# Patient Record
Sex: Male | Born: 1965 | Race: White | Hispanic: No | Marital: Single | Smoking: Former smoker
Health system: Southern US, Community
[De-identification: ages and names within clinical notes are randomized; demographics above are authoritative.]

## PROBLEM LIST (undated history)

## (undated) DIAGNOSIS — N200 Calculus of kidney: Secondary | ICD-10-CM

## (undated) DIAGNOSIS — C4359 Malignant melanoma of other part of trunk: Secondary | ICD-10-CM

## (undated) DIAGNOSIS — K5792 Diverticulitis of intestine, part unspecified, without perforation or abscess without bleeding: Secondary | ICD-10-CM

## (undated) DIAGNOSIS — K631 Perforation of intestine (nontraumatic): Secondary | ICD-10-CM

## (undated) DIAGNOSIS — I10 Essential (primary) hypertension: Secondary | ICD-10-CM

## (undated) DIAGNOSIS — E119 Type 2 diabetes mellitus without complications: Secondary | ICD-10-CM

## (undated) DIAGNOSIS — I509 Heart failure, unspecified: Secondary | ICD-10-CM

## (undated) HISTORY — PX: OTHER SURGICAL HISTORY: SHX169

## (undated) HISTORY — PX: COLON SURGERY: SHX602

## (undated) HISTORY — PX: KIDNEY STONE SURGERY: SHX686

---

## 1999-07-27 ENCOUNTER — Emergency Department (HOSPITAL_COMMUNITY): Admission: EM | Admit: 1999-07-27 | Discharge: 1999-07-27 | Payer: Self-pay | Admitting: Emergency Medicine

## 1999-07-27 ENCOUNTER — Encounter: Payer: Self-pay | Admitting: Emergency Medicine

## 1999-08-20 ENCOUNTER — Other Ambulatory Visit: Admission: RE | Admit: 1999-08-20 | Discharge: 1999-08-20 | Payer: Self-pay

## 1999-11-06 ENCOUNTER — Ambulatory Visit: Admission: RE | Admit: 1999-11-06 | Discharge: 1999-11-06 | Payer: Self-pay

## 2000-02-26 ENCOUNTER — Ambulatory Visit: Admission: RE | Admit: 2000-02-26 | Discharge: 2000-02-26 | Payer: Self-pay | Admitting: Otolaryngology

## 2002-10-26 ENCOUNTER — Emergency Department (HOSPITAL_COMMUNITY): Admission: EM | Admit: 2002-10-26 | Discharge: 2002-10-27 | Payer: Self-pay | Admitting: Emergency Medicine

## 2002-10-26 ENCOUNTER — Encounter: Payer: Self-pay | Admitting: Emergency Medicine

## 2002-10-27 ENCOUNTER — Encounter: Payer: Self-pay | Admitting: Emergency Medicine

## 2009-04-15 ENCOUNTER — Emergency Department (HOSPITAL_COMMUNITY): Admission: EM | Admit: 2009-04-15 | Discharge: 2009-04-15 | Payer: Self-pay | Admitting: Emergency Medicine

## 2009-10-25 ENCOUNTER — Inpatient Hospital Stay (HOSPITAL_COMMUNITY): Admission: EM | Admit: 2009-10-25 | Discharge: 2009-10-31 | Payer: Self-pay | Admitting: Emergency Medicine

## 2009-12-08 ENCOUNTER — Inpatient Hospital Stay (HOSPITAL_COMMUNITY): Admission: EM | Admit: 2009-12-08 | Discharge: 2009-12-21 | Payer: Self-pay | Admitting: Emergency Medicine

## 2010-07-08 ENCOUNTER — Inpatient Hospital Stay (HOSPITAL_COMMUNITY): Admission: EM | Admit: 2010-07-08 | Discharge: 2010-07-13 | Payer: Self-pay | Admitting: Emergency Medicine

## 2010-08-11 ENCOUNTER — Ambulatory Visit (HOSPITAL_COMMUNITY): Admission: RE | Admit: 2010-08-11 | Discharge: 2010-08-11 | Payer: Self-pay | Admitting: Urology

## 2010-09-11 ENCOUNTER — Inpatient Hospital Stay (HOSPITAL_COMMUNITY): Admission: EM | Admit: 2010-09-11 | Discharge: 2010-09-19 | Payer: Self-pay | Admitting: Emergency Medicine

## 2010-09-12 ENCOUNTER — Encounter (INDEPENDENT_AMBULATORY_CARE_PROVIDER_SITE_OTHER): Payer: Self-pay

## 2011-01-14 LAB — DIFFERENTIAL
Basophils Absolute: 0 10*3/uL (ref 0.0–0.1)
Basophils Absolute: 0 10*3/uL (ref 0.0–0.1)
Basophils Absolute: 0 10*3/uL (ref 0.0–0.1)
Basophils Absolute: 0 10*3/uL (ref 0.0–0.1)
Basophils Absolute: 0.1 10*3/uL (ref 0.0–0.1)
Basophils Relative: 0 % (ref 0–1)
Basophils Relative: 0 % (ref 0–1)
Basophils Relative: 1 % (ref 0–1)
Eosinophils Absolute: 0 10*3/uL (ref 0.0–0.7)
Eosinophils Absolute: 0.2 10*3/uL (ref 0.0–0.7)
Eosinophils Absolute: 0.2 10*3/uL (ref 0.0–0.7)
Eosinophils Relative: 0 % (ref 0–5)
Eosinophils Relative: 2 % (ref 0–5)
Eosinophils Relative: 2 % (ref 0–5)
Eosinophils Relative: 3 % (ref 0–5)
Eosinophils Relative: 3 % (ref 0–5)
Eosinophils Relative: 3 % (ref 0–5)
Lymphocytes Relative: 16 % (ref 12–46)
Lymphocytes Relative: 19 % (ref 12–46)
Lymphocytes Relative: 25 % (ref 12–46)
Lymphocytes Relative: 5 % — ABNORMAL LOW (ref 12–46)
Lymphs Abs: 1.2 10*3/uL (ref 0.7–4.0)
Lymphs Abs: 1.4 10*3/uL (ref 0.7–4.0)
Lymphs Abs: 1.4 10*3/uL (ref 0.7–4.0)
Lymphs Abs: 1.9 10*3/uL (ref 0.7–4.0)
Monocytes Absolute: 0.4 10*3/uL (ref 0.1–1.0)
Monocytes Absolute: 0.4 10*3/uL (ref 0.1–1.0)
Monocytes Absolute: 0.4 10*3/uL (ref 0.1–1.0)
Monocytes Absolute: 0.5 10*3/uL (ref 0.1–1.0)
Monocytes Absolute: 0.5 10*3/uL (ref 0.1–1.0)
Monocytes Absolute: 0.8 10*3/uL (ref 0.1–1.0)
Monocytes Relative: 5 % (ref 3–12)
Monocytes Relative: 7 % (ref 3–12)
Monocytes Relative: 8 % (ref 3–12)
Monocytes Relative: 8 % (ref 3–12)
Neutro Abs: 12.5 10*3/uL — ABNORMAL HIGH (ref 1.7–7.7)
Neutro Abs: 3.2 10*3/uL (ref 1.7–7.7)
Neutro Abs: 4.1 10*3/uL (ref 1.7–7.7)
Neutro Abs: 5.2 10*3/uL (ref 1.7–7.7)
Neutro Abs: 6 10*3/uL (ref 1.7–7.7)
Neutro Abs: 6.9 10*3/uL (ref 1.7–7.7)
Neutrophils Relative %: 59 % (ref 43–77)
Neutrophils Relative %: 85 % — ABNORMAL HIGH (ref 43–77)
Neutrophils Relative %: 90 % — ABNORMAL HIGH (ref 43–77)

## 2011-01-14 LAB — COMPREHENSIVE METABOLIC PANEL
ALT: 13 U/L (ref 0–53)
Alkaline Phosphatase: 56 U/L (ref 39–117)
BUN: 16 mg/dL (ref 6–23)
CO2: 28 mEq/L (ref 19–32)
Calcium: 9 mg/dL (ref 8.4–10.5)
GFR calc non Af Amer: 55 mL/min — ABNORMAL LOW (ref >60)
Glucose, Bld: 182 mg/dL — ABNORMAL HIGH (ref 70–99)
Total Protein: 7.2 g/dL (ref 6.0–8.3)

## 2011-01-14 LAB — BASIC METABOLIC PANEL
BUN: 15 mg/dL (ref 6–23)
BUN: 16 mg/dL (ref 6–23)
BUN: 8 mg/dL (ref 6–23)
CO2: 30 mEq/L (ref 19–32)
CO2: 31 mEq/L (ref 19–32)
CO2: 31 mEq/L (ref 19–32)
Calcium: 8.1 mg/dL — ABNORMAL LOW (ref 8.4–10.5)
Calcium: 8.6 mg/dL (ref 8.4–10.5)
Chloride: 102 mEq/L (ref 96–112)
Chloride: 103 mEq/L (ref 96–112)
Chloride: 103 mEq/L (ref 96–112)
Chloride: 103 mEq/L (ref 96–112)
Chloride: 108 mEq/L (ref 96–112)
Creatinine, Ser: 0.71 mg/dL (ref 0.4–1.5)
Creatinine, Ser: 0.76 mg/dL (ref 0.4–1.5)
Creatinine, Ser: 0.87 mg/dL (ref 0.4–1.5)
Creatinine, Ser: 0.9 mg/dL (ref 0.4–1.5)
Creatinine, Ser: 1.18 mg/dL (ref 0.4–1.5)
GFR calc Af Amer: >60 mL/min (ref >60)
GFR calc Af Amer: >60 mL/min (ref >60)
GFR calc Af Amer: >60 mL/min (ref >60)
GFR calc non Af Amer: >60 mL/min (ref >60)
GFR calc non Af Amer: >60 mL/min (ref >60)
GFR calc non Af Amer: >60 mL/min (ref >60)
GFR calc non Af Amer: >60 mL/min (ref >60)
GFR calc non Af Amer: >60 mL/min (ref >60)
GFR calc non Af Amer: >60 mL/min (ref >60)
Glucose, Bld: 106 mg/dL — ABNORMAL HIGH (ref 70–99)
Glucose, Bld: 124 mg/dL — ABNORMAL HIGH (ref 70–99)
Glucose, Bld: 125 mg/dL — ABNORMAL HIGH (ref 70–99)
Glucose, Bld: 145 mg/dL — ABNORMAL HIGH (ref 70–99)
Glucose, Bld: 242 mg/dL — ABNORMAL HIGH (ref 70–99)
Potassium: 3.7 mEq/L (ref 3.5–5.1)
Potassium: 3.7 mEq/L (ref 3.5–5.1)
Potassium: 4.1 mEq/L (ref 3.5–5.1)
Potassium: 4.1 mEq/L (ref 3.5–5.1)
Sodium: 136 mEq/L (ref 135–145)
Sodium: 138 mEq/L (ref 135–145)
Sodium: 138 mEq/L (ref 135–145)
Sodium: 140 mEq/L (ref 135–145)

## 2011-01-14 LAB — GLUCOSE, CAPILLARY
Glucose-Capillary: 113 mg/dL — ABNORMAL HIGH (ref 70–99)
Glucose-Capillary: 134 mg/dL — ABNORMAL HIGH (ref 70–99)
Glucose-Capillary: 134 mg/dL — ABNORMAL HIGH (ref 70–99)
Glucose-Capillary: 138 mg/dL — ABNORMAL HIGH (ref 70–99)
Glucose-Capillary: 138 mg/dL — ABNORMAL HIGH (ref 70–99)
Glucose-Capillary: 142 mg/dL — ABNORMAL HIGH (ref 70–99)
Glucose-Capillary: 142 mg/dL — ABNORMAL HIGH (ref 70–99)
Glucose-Capillary: 142 mg/dL — ABNORMAL HIGH (ref 70–99)
Glucose-Capillary: 155 mg/dL — ABNORMAL HIGH (ref 70–99)
Glucose-Capillary: 157 mg/dL — ABNORMAL HIGH (ref 70–99)
Glucose-Capillary: 170 mg/dL — ABNORMAL HIGH (ref 70–99)
Glucose-Capillary: 177 mg/dL — ABNORMAL HIGH (ref 70–99)
Glucose-Capillary: 227 mg/dL — ABNORMAL HIGH (ref 70–99)

## 2011-01-14 LAB — LIPASE, BLOOD: Lipase: 19 U/L (ref 11–59)

## 2011-01-14 LAB — CBC
HCT: 38.5 % — ABNORMAL LOW (ref 39.0–52.0)
HCT: 38.6 % — ABNORMAL LOW (ref 39.0–52.0)
HCT: 38.7 % — ABNORMAL LOW (ref 39.0–52.0)
HCT: 39.2 % (ref 39.0–52.0)
HCT: 39.3 % (ref 39.0–52.0)
HCT: 44.5 % (ref 39.0–52.0)
Hemoglobin: 12.2 g/dL — ABNORMAL LOW (ref 13.0–17.0)
Hemoglobin: 12.2 g/dL — ABNORMAL LOW (ref 13.0–17.0)
Hemoglobin: 12.5 g/dL — ABNORMAL LOW (ref 13.0–17.0)
Hemoglobin: 12.6 g/dL — ABNORMAL LOW (ref 13.0–17.0)
Hemoglobin: 14.9 g/dL (ref 13.0–17.0)
MCH: 27.4 pg (ref 26.0–34.0)
MCH: 27.5 pg (ref 26.0–34.0)
MCH: 27.6 pg (ref 26.0–34.0)
MCHC: 31.1 g/dL (ref 30.0–36.0)
MCHC: 31.6 g/dL (ref 30.0–36.0)
MCHC: 32.2 g/dL (ref 30.0–36.0)
MCHC: 33.5 g/dL (ref 30.0–36.0)
MCV: 83 fL (ref 78.0–100.0)
MCV: 84.1 fL (ref 78.0–100.0)
MCV: 84.5 fL (ref 78.0–100.0)
MCV: 85.1 fL (ref 78.0–100.0)
MCV: 85.2 fL (ref 78.0–100.0)
MCV: 88.5 fL (ref 78.0–100.0)
MCV: 88.6 fL (ref 78.0–100.0)
Platelets: 208 10*3/uL (ref 150–400)
Platelets: 237 10*3/uL (ref 150–400)
Platelets: 259 10*3/uL (ref 150–400)
Platelets: 296 10*3/uL (ref 150–400)
RBC: 4.37 MIL/uL (ref 4.22–5.81)
RBC: 4.45 MIL/uL (ref 4.22–5.81)
RBC: 4.62 MIL/uL (ref 4.22–5.81)
RDW: 13.6 % (ref 11.5–15.5)
RDW: 14.1 % (ref 11.5–15.5)
RDW: 14.2 % (ref 11.5–15.5)
RDW: 14.2 % (ref 11.5–15.5)
RDW: 14.4 % (ref 11.5–15.5)
RDW: 14.4 % (ref 11.5–15.5)
WBC: 13.1 10*3/uL — ABNORMAL HIGH (ref 4.0–10.5)
WBC: 6.4 10*3/uL (ref 4.0–10.5)
WBC: 7.2 10*3/uL (ref 4.0–10.5)
WBC: 7.9 10*3/uL (ref 4.0–10.5)

## 2011-01-14 LAB — POCT I-STAT 3, ART BLOOD GAS (G3+)
Acid-Base Excess: 2 mmol/L (ref 0.0–2.0)
Bicarbonate: 28.1 mEq/L — ABNORMAL HIGH (ref 20.0–24.0)
Bicarbonate: 29.4 mEq/L — ABNORMAL HIGH (ref 20.0–24.0)
Patient temperature: 98.1
Patient temperature: 99.8
pCO2 arterial: 55.9 mmHg — ABNORMAL HIGH (ref 35.0–45.0)
pH, Arterial: 7.306 — ABNORMAL LOW (ref 7.350–7.450)
pH, Arterial: 7.438 (ref 7.350–7.450)
pO2, Arterial: 88 mmHg (ref 80.0–100.0)

## 2011-01-14 LAB — URINE CULTURE: Culture: NO GROWTH

## 2011-01-14 LAB — MRSA PCR SCREENING: MRSA by PCR: NEGATIVE

## 2011-01-15 LAB — BASIC METABOLIC PANEL
BUN: 11 mg/dL (ref 6–23)
BUN: 15 mg/dL (ref 6–23)
CO2: 32 mEq/L (ref 19–32)
Calcium: 8.6 mg/dL (ref 8.4–10.5)
Calcium: 8.7 mg/dL (ref 8.4–10.5)
Chloride: 102 mEq/L (ref 96–112)
Creatinine, Ser: 1.03 mg/dL (ref 0.4–1.5)
GFR calc Af Amer: >60 mL/min (ref >60)
GFR calc Af Amer: >60 mL/min (ref >60)
GFR calc non Af Amer: >60 mL/min (ref >60)
GFR calc non Af Amer: >60 mL/min (ref >60)
Glucose, Bld: 149 mg/dL — ABNORMAL HIGH (ref 70–99)
Potassium: 3.5 mEq/L (ref 3.5–5.1)
Sodium: 138 mEq/L (ref 135–145)

## 2011-01-15 LAB — CBC
HCT: 38.8 % — ABNORMAL LOW (ref 39.0–52.0)
HCT: 41.8 % (ref 39.0–52.0)
Hemoglobin: 13.1 g/dL (ref 13.0–17.0)
Hemoglobin: 14.3 g/dL (ref 13.0–17.0)
MCHC: 33.8 g/dL (ref 30.0–36.0)
MCV: 85.5 fL (ref 78.0–100.0)
MCV: 86.4 fL (ref 78.0–100.0)
Platelets: 257 10*3/uL (ref 150–400)
RBC: 4.48 MIL/uL (ref 4.22–5.81)
RDW: 13 % (ref 11.5–15.5)
RDW: 13.2 % (ref 11.5–15.5)
RDW: 13.2 % (ref 11.5–15.5)
WBC: 11.2 10*3/uL — ABNORMAL HIGH (ref 4.0–10.5)
WBC: 7.6 10*3/uL (ref 4.0–10.5)

## 2011-01-15 LAB — URINE MICROSCOPIC-ADD ON

## 2011-01-15 LAB — URINALYSIS, ROUTINE W REFLEX MICROSCOPIC
Glucose, UA: NEGATIVE mg/dL
Hgb urine dipstick: NEGATIVE
Ketones, ur: 15 mg/dL — AB
Specific Gravity, Urine: 1.031 — ABNORMAL HIGH (ref 1.005–1.030)
pH: 6 (ref 5.0–8.0)

## 2011-01-15 LAB — DIFFERENTIAL
Basophils Relative: 0 % (ref 0–1)
Basophils Relative: 1 % (ref 0–1)
Eosinophils Absolute: 0.2 10*3/uL (ref 0.0–0.7)
Eosinophils Absolute: 0.2 10*3/uL (ref 0.0–0.7)
Lymphs Abs: 2.1 10*3/uL (ref 0.7–4.0)
Lymphs Abs: 2.4 10*3/uL (ref 0.7–4.0)
Monocytes Absolute: 0.8 10*3/uL (ref 0.1–1.0)
Monocytes Relative: 8 % (ref 3–12)
Neutro Abs: 5.3 10*3/uL (ref 1.7–7.7)
Neutro Abs: 7.8 10*3/uL — ABNORMAL HIGH (ref 1.7–7.7)
Neutrophils Relative %: 64 % (ref 43–77)
Neutrophils Relative %: 70 % (ref 43–77)

## 2011-01-15 LAB — COMPREHENSIVE METABOLIC PANEL
ALT: 14 U/L (ref 0–53)
ALT: 16 U/L (ref 0–53)
AST: 15 U/L (ref 0–37)
Alkaline Phosphatase: 54 U/L (ref 39–117)
Alkaline Phosphatase: 55 U/L (ref 39–117)
BUN: 20 mg/dL (ref 6–23)
CO2: 28 mEq/L (ref 19–32)
CO2: 32 mEq/L (ref 19–32)
Calcium: 8.4 mg/dL (ref 8.4–10.5)
GFR calc Af Amer: 54 mL/min — ABNORMAL LOW (ref >60)
GFR calc non Af Amer: >60 mL/min (ref >60)
Glucose, Bld: 132 mg/dL — ABNORMAL HIGH (ref 70–99)
Glucose, Bld: 171 mg/dL — ABNORMAL HIGH (ref 70–99)
Potassium: 3 mEq/L — ABNORMAL LOW (ref 3.5–5.1)
Sodium: 136 mEq/L (ref 135–145)
Sodium: 138 mEq/L (ref 135–145)
Total Protein: 6.7 g/dL (ref 6.0–8.3)
Total Protein: 7.3 g/dL (ref 6.0–8.3)

## 2011-01-15 LAB — URINE CULTURE: Culture  Setup Time: 201109061651

## 2011-01-15 LAB — HEMOGLOBIN A1C
Hgb A1c MFr Bld: 6.1 % — ABNORMAL HIGH (ref <5.7)
Mean Plasma Glucose: 128 mg/dL — ABNORMAL HIGH (ref <117)

## 2011-01-15 LAB — SURGICAL PCR SCREEN: MRSA, PCR: NEGATIVE

## 2011-01-15 LAB — LIPASE, BLOOD: Lipase: 25 U/L (ref 11–59)

## 2011-01-15 LAB — MAGNESIUM: Magnesium: 2.2 mg/dL (ref 1.5–2.5)

## 2011-01-23 ENCOUNTER — Encounter (HOSPITAL_COMMUNITY)
Admission: RE | Admit: 2011-01-23 | Discharge: 2011-01-23 | Disposition: A | Discharge status: Home / Self Care | Payer: Self-pay | Source: Ambulatory Visit | Attending: General Surgery | Admitting: General Surgery

## 2011-01-23 DIAGNOSIS — Z01812 Encounter for preprocedural laboratory examination: Secondary | ICD-10-CM | POA: Insufficient documentation

## 2011-01-23 LAB — COMPREHENSIVE METABOLIC PANEL
ALT: 19 U/L (ref 0–53)
Alkaline Phosphatase: 56 U/L (ref 39–117)
Chloride: 98 mEq/L (ref 96–112)
Glucose, Bld: 136 mg/dL — ABNORMAL HIGH (ref 70–99)
Potassium: 3.5 mEq/L (ref 3.5–5.1)
Sodium: 134 mEq/L — ABNORMAL LOW (ref 135–145)
Total Bilirubin: 1.6 mg/dL — ABNORMAL HIGH (ref 0.3–1.2)
Total Protein: 7 g/dL (ref 6.0–8.3)

## 2011-01-23 LAB — CBC
HCT: 38.1 % — ABNORMAL LOW (ref 39.0–52.0)
HCT: 38.2 % — ABNORMAL LOW (ref 39.0–52.0)
HCT: 43 % (ref 39.0–52.0)
HCT: 45.6 % (ref 39.0–52.0)
Hemoglobin: 12.8 g/dL — ABNORMAL LOW (ref 13.0–17.0)
Hemoglobin: 13.2 g/dL (ref 13.0–17.0)
Hemoglobin: 14.6 g/dL (ref 13.0–17.0)
Hemoglobin: 15.6 g/dL (ref 13.0–17.0)
MCH: 27.9 pg (ref 26.0–34.0)
MCHC: 33.8 g/dL (ref 30.0–36.0)
MCHC: 34 g/dL (ref 30.0–36.0)
MCHC: 34.4 g/dL (ref 30.0–36.0)
MCHC: 34.2 g/dL (ref 30.0–36.0)
MCV: 84.8 fL (ref 78.0–100.0)
MCV: 85.5 fL (ref 78.0–100.0)
MCV: 81.4 fL (ref 78.0–100.0)
Platelets: 333 10*3/uL (ref 150–400)
RBC: 4.24 MIL/uL (ref 4.22–5.81)
RBC: 4.42 MIL/uL (ref 4.22–5.81)
RBC: 4.49 MIL/uL (ref 4.22–5.81)
RBC: 4.6 MIL/uL (ref 4.22–5.81)
RBC: 4.93 MIL/uL (ref 4.22–5.81)
RDW: 14.7 % (ref 11.5–15.5)
RDW: 15.1 % (ref 11.5–15.5)
RDW: 15.4 % (ref 11.5–15.5)
WBC: 10.6 10*3/uL — ABNORMAL HIGH (ref 4.0–10.5)
WBC: 11.3 10*3/uL — ABNORMAL HIGH (ref 4.0–10.5)
WBC: 13.7 10*3/uL — ABNORMAL HIGH (ref 4.0–10.5)
WBC: 7.5 10*3/uL (ref 4.0–10.5)

## 2011-01-23 LAB — ANAEROBIC CULTURE

## 2011-01-23 LAB — DIFFERENTIAL
Basophils Relative: 0 % (ref 0–1)
Basophils Relative: 1 % (ref 0–1)
Eosinophils Absolute: 0 10*3/uL (ref 0.0–0.7)
Eosinophils Absolute: 0.1 10*3/uL (ref 0.0–0.7)
Eosinophils Relative: 0 % (ref 0–5)
Lymphocytes Relative: 30 % (ref 12–46)
Lymphs Abs: 2.3 10*3/uL (ref 0.7–4.0)
Lymphs Abs: 2.3 10*3/uL (ref 0.7–4.0)
Lymphs Abs: 1.9 10*3/uL (ref 0.7–4.0)
Monocytes Absolute: 0.5 10*3/uL (ref 0.1–1.0)
Monocytes Relative: 5 % (ref 3–12)
Monocytes Relative: 7 % (ref 3–12)
Monocytes Relative: 7 % (ref 3–12)
Neutro Abs: 4.8 10*3/uL (ref 1.7–7.7)
Neutrophils Relative %: 61 % (ref 43–77)

## 2011-01-23 LAB — BASIC METABOLIC PANEL
BUN: 8 mg/dL (ref 6–23)
BUN: 16 mg/dL (ref 6–23)
CO2: 30 mEq/L (ref 19–32)
CO2: 34 mEq/L — ABNORMAL HIGH (ref 19–32)
Calcium: 8.4 mg/dL (ref 8.4–10.5)
Calcium: 8.6 mg/dL (ref 8.4–10.5)
Calcium: 9.7 mg/dL (ref 8.4–10.5)
Chloride: 99 mEq/L (ref 96–112)
Chloride: 100 mEq/L (ref 96–112)
Creatinine, Ser: 0.86 mg/dL (ref 0.4–1.5)
Creatinine, Ser: 1.01 mg/dL (ref 0.4–1.5)
Creatinine, Ser: 1.18 mg/dL (ref 0.4–1.5)
GFR calc Af Amer: >60 mL/min (ref >60)
GFR calc Af Amer: >60 mL/min (ref >60)
GFR calc Af Amer: >60 mL/min (ref >60)
GFR calc non Af Amer: >60 mL/min (ref >60)
Glucose, Bld: 151 mg/dL — ABNORMAL HIGH (ref 70–99)
Glucose, Bld: 215 mg/dL — ABNORMAL HIGH (ref 70–99)
Sodium: 134 mEq/L — ABNORMAL LOW (ref 135–145)

## 2011-01-23 LAB — URINE MICROSCOPIC-ADD ON

## 2011-01-23 LAB — URINALYSIS, ROUTINE W REFLEX MICROSCOPIC
Bilirubin Urine: NEGATIVE
Glucose, UA: NEGATIVE mg/dL
Hgb urine dipstick: NEGATIVE
Ketones, ur: NEGATIVE mg/dL
Protein, ur: 30 mg/dL — AB
pH: 7 (ref 5.0–8.0)

## 2011-01-23 LAB — CULTURE, ROUTINE-ABSCESS

## 2011-01-27 ENCOUNTER — Other Ambulatory Visit: Payer: Self-pay | Admitting: General Surgery

## 2011-01-27 ENCOUNTER — Inpatient Hospital Stay (HOSPITAL_COMMUNITY)
Admission: RE | Admit: 2011-01-27 | Discharge: 2011-02-03 | DRG: 331 | Disposition: A | Discharge status: Home / Self Care | Payer: Self-pay | Source: Ambulatory Visit | Attending: General Surgery | Admitting: General Surgery

## 2011-01-27 DIAGNOSIS — Z01812 Encounter for preprocedural laboratory examination: Secondary | ICD-10-CM

## 2011-01-27 DIAGNOSIS — F411 Generalized anxiety disorder: Secondary | ICD-10-CM | POA: Diagnosis present

## 2011-01-27 DIAGNOSIS — F329 Major depressive disorder, single episode, unspecified: Secondary | ICD-10-CM | POA: Diagnosis present

## 2011-01-27 DIAGNOSIS — E119 Type 2 diabetes mellitus without complications: Secondary | ICD-10-CM | POA: Diagnosis present

## 2011-01-27 DIAGNOSIS — F3289 Other specified depressive episodes: Secondary | ICD-10-CM | POA: Diagnosis present

## 2011-01-27 DIAGNOSIS — G4733 Obstructive sleep apnea (adult) (pediatric): Secondary | ICD-10-CM | POA: Diagnosis present

## 2011-01-27 DIAGNOSIS — I10 Essential (primary) hypertension: Secondary | ICD-10-CM | POA: Diagnosis present

## 2011-01-27 DIAGNOSIS — E669 Obesity, unspecified: Secondary | ICD-10-CM | POA: Diagnosis present

## 2011-01-27 DIAGNOSIS — Z433 Encounter for attention to colostomy: Principal | ICD-10-CM

## 2011-01-28 LAB — BASIC METABOLIC PANEL
BUN: 24 mg/dL — ABNORMAL HIGH (ref 6–23)
Calcium: 8.6 mg/dL (ref 8.4–10.5)
GFR calc non Af Amer: 50 mL/min — ABNORMAL LOW (ref >60)
Potassium: 4 mEq/L (ref 3.5–5.1)
Sodium: 138 mEq/L (ref 135–145)

## 2011-01-28 LAB — CBC
MCHC: 32.7 g/dL (ref 30.0–36.0)
MCV: 83.4 fL (ref 78.0–100.0)
Platelets: 237 10*3/uL (ref 150–400)
RDW: 14.1 % (ref 11.5–15.5)
WBC: 10.8 10*3/uL — ABNORMAL HIGH (ref 4.0–10.5)

## 2011-01-28 LAB — DIFFERENTIAL
Lymphocytes Relative: 17 % (ref 12–46)
Lymphs Abs: 1.8 10*3/uL (ref 0.7–4.0)
Monocytes Relative: 10 % (ref 3–12)
Neutro Abs: 7.9 10*3/uL — ABNORMAL HIGH (ref 1.7–7.7)
Neutrophils Relative %: 73 % (ref 43–77)

## 2011-01-28 LAB — GLUCOSE, CAPILLARY
Glucose-Capillary: 140 mg/dL — ABNORMAL HIGH (ref 70–99)
Glucose-Capillary: 135 mg/dL — ABNORMAL HIGH (ref 70–99)

## 2011-01-28 NOTE — Op Note (Signed)
Jeffrey Benitez, Jeffrey Benitez               ACCOUNT NO.:  [id number]  MEDICAL RECORD NO.:  [id number]           PATIENT TYPE:  I  LOCATION:  5153                         FACILITY:  [facility]  PHYSICIAN:  Ollen Gross. Vernell Morgans, M.D. 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  PROCEDURE:  Colostomy takedown.  SURGEON:  Ollen Gross. Vernell Morgans, MD  ASSISTANT:  Ardeth Sportsman, MD  ANESTHESIA:  General endotracheal.  PROCEDURE:  After informed consent was obtained, the patient was brought to the operating room, on the operating room table.  After adequate induction of general anesthesia, the patient's abdomen was prepped with ChloraPrep and the peritoneum was prepped with Betadine and allowed to dry and then draped in usual sterile manner.  The patient's old midline scar was excised sharply with a 10 blade knife and electrocautery.  The incision was then carried down through the subcutaneous tissue until the fascia of the anterior abdominal wall was encountered.  This was also incised with the electrocautery until we were able to enter the abdominal cavity.  The patient had a large bit of omentum that was covering the intestines and this layer of omentum was adherent to the anterior abdominal wall.  These adhesions were taken down by combination of blunt finger dissection and some sharp dissection with the electrocautery and Metzenbaum scissors.  This dissection was carried out along the abdominal wall until free spaces were identified circumferentially.  Once the omentum was freed up, we were able to reflect it upwards and anteriorly and then there were lot of filmy interloop adhesions of the small bowel.  These were all broken up by combination of blunt finger  dissection and some sharp dissection with the electrocautery and Metzenbaum scissors.  Once the small bowel was freed up, we were then able to reflect it upwards and the abdomen with some moist laps and the Bookwalter retractor was deployed and the malleable clamps with a Bookwalter were used to hold the small bowel up in the upper abdomen.  We were then able to identify the rectal stump. It appeared to be fairly free and pretty good length on it.  Once this was identified and appeared to be free, we then concentrated our dissection on the ostomy.  We were able to free up the ostomy circumferentially with combination of blunt finger dissection and some sharp dissection with the electrocautery.  Once the ostomy was freed up and the dissection was carried up into the abdominal wall around the ostomy, we then began the dissection externally at the skin level of the ostomy.  Prior to the starting the case, we closed the external ostomy with a 0 Vicryl pursestring stitch.  An elliptical incision was made around the colostomy with a 15 blade knife.  This incision was carried down through the skin and subcutaneous tissue sharply with electrocautery.  Once into the subcutaneous tissue, we were able to identify the plane that was begun  from the end dissection and inside the abdomen.  We dissected the ostomy circumferentially until it was completely free.  We were then able to drop the ostomy through this opening and back into the abdominal cavity.  The last few centimeters of the ostomy, we decided to resect.  We dissected the mesentery meeting this section of the colostomy bluntly with a hemostat and then took the mesentery to this segment down with the Enseal super jaw.  We placed a Allen clamp across the colon at a site where it appeared to be healthy and then divided the distal portion with a 10 blade knife on the Allen clamp and sent this to pathology.  The attachment of the left colon to the  retroperitoneum was also freed up some by incising its retroperitoneal attachment along the white line of Toldt.  At this point, we had really good length on the upper segment of colon and I easily reached down to the distal segment.  We used some 2-0 silk suture stays and placed a curved soft bowel clamp proximally on the colon.  The staple line to the distal stump was then excised sharply with the electrocautery.  The retraction was again deployed using the Bookwalter retractor.  A anastomosis was then created between the proximal distal segment of colon and rectum using full-thickness interrupted 2-0 silk stitches circumferentially.  Posterior wall was created first and then the anterior wall was created.  The stitches were checked periodically to make sure that there were no gaps.  Once this was finished, we had a good nice widely patent anastomosis between the colon and below the left colon and the upper rectum.  It appeared to be widely patent.  We kept a soft bowel clamp on and then Dr. Michaell Cowing went below and did a rigid sigmoidoscopy and insufflated the rectosigmoid.  We did this with saline in the pelvis and we were able to see that there was good air distention of the anastomosis with absolutely no air leak whatsoever and it appeared to be nice and healthy.  At this point, the air was evacuated. The abdomen was irrigated with copious amounts of saline.  A small hole in the omentum was closed with a running 3-0 Vicryl stitch.  The colostomy opening on the abdominal wall was then closed from the inside with interrupted #1 Novafil stitches.  We then placed several interrupted #1 PDS stitches from within the abdominal wall to close the anterior fascia.  At this point, everything looked good.  We then closed the anterior abdominal wall fascia with 2 running #1 double-stranded loop PDS sutures.  The subcutaneous tissue and both incisions were irrigated with copious amounts of saline and  Betadine.  The patient had a lot of thick scar and skin retraction that made the skin not very mobile and difficult to pull together with staples.  We, therefore, closed most of the common abdominal wall incisions with interrupted 2-0 nylon vertical mattress stitches.  Some of the gaps were filled in with staples.  We then placed a skin vac with Adaptic over each of the incisions and placed this dissection.  We had a good seal and good suction and this was the sterile dressing that was applied.  The patient tolerated the procedure well.  At the end of the case, all needle, sponge and instrument counts were correct.  The patient was then awakened and taken to recovery room in stable condition.     Ollen Gross. Vernell Morgans, M.D.  PST/MEDQ  D:  01/27/2011  T:  01/28/2011  Job:  [id number]  Electronically Signed by Chevis Pretty III M.D. on 01/28/2011 03:32:37 AM

## 2011-01-29 LAB — BASIC METABOLIC PANEL
Chloride: 101 mEq/L (ref 96–112)
GFR calc Af Amer: >60 mL/min (ref >60)
GFR calc non Af Amer: >60 mL/min (ref >60)
Potassium: 4.6 mEq/L (ref 3.5–5.1)
Sodium: 138 mEq/L (ref 135–145)

## 2011-01-29 LAB — DIFFERENTIAL
Basophils Absolute: 0 10*3/uL (ref 0.0–0.1)
Basophils Relative: 0 % (ref 0–1)
Eosinophils Absolute: 0 10*3/uL (ref 0.0–0.7)
Lymphocytes Relative: 14 % (ref 12–46)
Monocytes Absolute: 0.7 10*3/uL (ref 0.1–1.0)
Neutrophils Relative %: 80 % — ABNORMAL HIGH (ref 43–77)

## 2011-01-29 LAB — CBC
MCV: 84.5 fL (ref 78.0–100.0)
Platelets: 165 10*3/uL (ref 150–400)
RBC: 5.3 MIL/uL (ref 4.22–5.81)
RDW: 14.1 % (ref 11.5–15.5)
WBC: 11.6 10*3/uL — ABNORMAL HIGH (ref 4.0–10.5)

## 2011-01-29 LAB — GLUCOSE, CAPILLARY
Glucose-Capillary: 170 mg/dL — ABNORMAL HIGH (ref 70–99)
Glucose-Capillary: 120 mg/dL — ABNORMAL HIGH (ref 70–99)

## 2011-01-30 LAB — GLUCOSE, CAPILLARY
Glucose-Capillary: 133 mg/dL — ABNORMAL HIGH (ref 70–99)
Glucose-Capillary: 140 mg/dL — ABNORMAL HIGH (ref 70–99)
Glucose-Capillary: 135 mg/dL — ABNORMAL HIGH (ref 70–99)
Glucose-Capillary: 138 mg/dL — ABNORMAL HIGH (ref 70–99)

## 2011-01-31 LAB — BASIC METABOLIC PANEL
BUN: 5 mg/dL — ABNORMAL LOW (ref 6–23)
CO2: 33 mEq/L — ABNORMAL HIGH (ref 19–32)
Chloride: 95 mEq/L — ABNORMAL LOW (ref 96–112)
Creatinine, Ser: 0.84 mg/dL (ref 0.4–1.5)
GFR calc Af Amer: >60 mL/min (ref >60)
Potassium: 3.4 mEq/L — ABNORMAL LOW (ref 3.5–5.1)

## 2011-01-31 LAB — CBC
Hemoglobin: 13.7 g/dL (ref 13.0–17.0)
MCH: 27.9 pg (ref 26.0–34.0)
MCV: 83.1 fL (ref 78.0–100.0)
Platelets: 265 10*3/uL (ref 150–400)
RBC: 4.91 MIL/uL (ref 4.22–5.81)
WBC: 10.8 10*3/uL — ABNORMAL HIGH (ref 4.0–10.5)

## 2011-02-01 LAB — GLUCOSE, CAPILLARY
Glucose-Capillary: 156 mg/dL — ABNORMAL HIGH (ref 70–99)
Glucose-Capillary: 164 mg/dL — ABNORMAL HIGH (ref 70–99)
Glucose-Capillary: 188 mg/dL — ABNORMAL HIGH (ref 70–99)

## 2011-02-02 LAB — CBC
HCT: 39.1 % (ref 39.0–52.0)
HCT: 46.3 % (ref 39.0–52.0)
Hemoglobin: 13.4 g/dL (ref 13.0–17.0)
MCHC: 34 g/dL (ref 30.0–36.0)
MCHC: 34.1 g/dL (ref 30.0–36.0)
MCV: 87 fL (ref 78.0–100.0)
MCV: 87.9 fL (ref 78.0–100.0)
Platelets: 278 10*3/uL (ref 150–400)
Platelets: 349 10*3/uL (ref 150–400)
RBC: 5.32 MIL/uL (ref 4.22–5.81)
RDW: 13.5 % (ref 11.5–15.5)
RDW: 13.7 % (ref 11.5–15.5)
WBC: 19.5 10*3/uL — ABNORMAL HIGH (ref 4.0–10.5)

## 2011-02-02 LAB — BASIC METABOLIC PANEL
BUN: 4 mg/dL — ABNORMAL LOW (ref 6–23)
CO2: 29 mEq/L (ref 19–32)
Calcium: 8.5 mg/dL (ref 8.4–10.5)
Creatinine, Ser: 0.89 mg/dL (ref 0.4–1.5)
GFR calc non Af Amer: >60 mL/min (ref >60)
Glucose, Bld: 148 mg/dL — ABNORMAL HIGH (ref 70–99)
Glucose, Bld: 113 mg/dL — ABNORMAL HIGH (ref 70–99)
Potassium: 3.5 mEq/L (ref 3.5–5.1)
Sodium: 135 mEq/L (ref 135–145)

## 2011-02-02 LAB — GLUCOSE, CAPILLARY: Glucose-Capillary: 180 mg/dL — ABNORMAL HIGH (ref 70–99)

## 2011-02-02 LAB — POCT I-STAT, CHEM 8
Calcium, Ion: 1.06 mmol/L — ABNORMAL LOW (ref 1.12–1.32)
Creatinine, Ser: 1.3 mg/dL (ref 0.4–1.5)
Glucose, Bld: 147 mg/dL — ABNORMAL HIGH (ref 70–99)
Hemoglobin: 17 g/dL (ref 13.0–17.0)
TCO2: 29 mmol/L (ref 0–100)

## 2011-02-02 LAB — PHOSPHORUS: Phosphorus: 3.1 mg/dL (ref 2.3–4.6)

## 2011-02-02 LAB — URINE MICROSCOPIC-ADD ON

## 2011-02-02 LAB — URINALYSIS, ROUTINE W REFLEX MICROSCOPIC
Hgb urine dipstick: NEGATIVE
Ketones, ur: NEGATIVE mg/dL
Protein, ur: 100 mg/dL — AB
Urobilinogen, UA: 4 mg/dL — ABNORMAL HIGH (ref 0.0–1.0)

## 2011-02-02 LAB — COMPREHENSIVE METABOLIC PANEL
Albumin: 2.6 g/dL — ABNORMAL LOW (ref 3.5–5.2)
BUN: 9 mg/dL (ref 6–23)
Calcium: 8.6 mg/dL (ref 8.4–10.5)
Creatinine, Ser: 0.78 mg/dL (ref 0.4–1.5)
Potassium: 3.7 mEq/L (ref 3.5–5.1)
Total Protein: 6.4 g/dL (ref 6.0–8.3)

## 2011-02-02 LAB — URINE CULTURE: Colony Count: NO GROWTH

## 2011-02-02 LAB — DIFFERENTIAL
Basophils Relative: 0 % (ref 0–1)
Eosinophils Absolute: 0 10*3/uL (ref 0.0–0.7)
Eosinophils Relative: 0 % (ref 0–5)
Lymphs Abs: 1.8 10*3/uL (ref 0.7–4.0)
Monocytes Relative: 5 % (ref 3–12)

## 2011-02-03 LAB — GLUCOSE, CAPILLARY
Glucose-Capillary: 207 mg/dL — ABNORMAL HIGH (ref 70–99)
Glucose-Capillary: 157 mg/dL — ABNORMAL HIGH (ref 70–99)
Glucose-Capillary: 152 mg/dL — ABNORMAL HIGH (ref 70–99)

## 2011-03-01 NOTE — Discharge Summary (Signed)
  NAMEPRICE, LACHAPELLE               ACCOUNT NO.:  [id number]  MEDICAL RECORD NO.:  [id number]           PATIENT TYPE:  I  LOCATION:  5153                         FACILITY:  [facility]  PHYSICIAN:  Ollen Gross. Vernell Morgans, M.D. DATE OF BIRTH:  1966-03-19  DATE OF ADMISSION:  01/27/2011 DATE OF DISCHARGE:  02/03/2011                              DISCHARGE SUMMARY   HISTORY OF PRESENT ILLNESS:  Mr. Kamau is a 45 year old white male who had a previous perforated colon for diverticulitis.  He had a colostomy. He is brought in on March 27 for colostomy takedown.  He was placed on the enteric protocol.  He tolerated the operation well.  On postop day #2, his Foley was discontinued.  His blood pressure continued to remain high.  He was covered with some p.r.n. Lopressor as well as some Ativan for anxiety.  On March 30, we were able to start him on clear liquids. He also had some significant sleep apnea and had placement of CPAP machine.  On April 1, he started having bowel movements.  His diet was advanced.  He was mobilized.  On April 3, he was tolerating his diet well, mobilizing well.  Pain was under control and he was ready for discharge home.  MEDICATIONS:  He was resumed his home meds and he was given a prescription for pain medicine.  DIET:  As tolerated.  ACTIVITY:  No heavy lifting.  FOLLOWUP:  Dr. Carolynne Edouard in a week.  FINAL DIAGNOSIS:  Colostomy from perforated sigmoid diverticulitis and he is discharged home.     Ollen Gross. Vernell Morgans, M.D.     PST/MEDQ  D:  02/24/2011  T:  02/24/2011  Job:  [id number]  Electronically Signed by Chevis Pretty III M.D. on 03/01/2011 06:41:04 AM

## 2013-08-21 ENCOUNTER — Encounter (HOSPITAL_COMMUNITY): Payer: Self-pay | Admitting: Emergency Medicine

## 2013-08-21 ENCOUNTER — Inpatient Hospital Stay (HOSPITAL_COMMUNITY)
Admission: EM | Admit: 2013-08-21 | Discharge: 2013-08-26 | DRG: 167 | Disposition: A | Discharge status: Home / Self Care | Payer: Medicaid Other | Attending: Internal Medicine | Admitting: Internal Medicine

## 2013-08-21 ENCOUNTER — Emergency Department (HOSPITAL_COMMUNITY): Payer: Medicaid Other

## 2013-08-21 DIAGNOSIS — E119 Type 2 diabetes mellitus without complications: Secondary | ICD-10-CM | POA: Diagnosis present

## 2013-08-21 DIAGNOSIS — G4733 Obstructive sleep apnea (adult) (pediatric): Secondary | ICD-10-CM | POA: Diagnosis present

## 2013-08-21 DIAGNOSIS — R918 Other nonspecific abnormal finding of lung field: Secondary | ICD-10-CM | POA: Diagnosis present

## 2013-08-21 DIAGNOSIS — Z87891 Personal history of nicotine dependence: Secondary | ICD-10-CM

## 2013-08-21 DIAGNOSIS — I10 Essential (primary) hypertension: Secondary | ICD-10-CM | POA: Diagnosis present

## 2013-08-21 DIAGNOSIS — Z6841 Body Mass Index (BMI) 40.0 and over, adult: Secondary | ICD-10-CM

## 2013-08-21 DIAGNOSIS — R109 Unspecified abdominal pain: Secondary | ICD-10-CM

## 2013-08-21 DIAGNOSIS — E669 Obesity, unspecified: Secondary | ICD-10-CM | POA: Diagnosis present

## 2013-08-21 DIAGNOSIS — D235 Other benign neoplasm of skin of trunk: Secondary | ICD-10-CM | POA: Diagnosis present

## 2013-08-21 DIAGNOSIS — R0902 Hypoxemia: Secondary | ICD-10-CM | POA: Diagnosis present

## 2013-08-21 DIAGNOSIS — N200 Calculus of kidney: Secondary | ICD-10-CM

## 2013-08-21 DIAGNOSIS — C3490 Malignant neoplasm of unspecified part of unspecified bronchus or lung: Principal | ICD-10-CM | POA: Diagnosis present

## 2013-08-21 DIAGNOSIS — I509 Heart failure, unspecified: Secondary | ICD-10-CM | POA: Diagnosis present

## 2013-08-21 DIAGNOSIS — L989 Disorder of the skin and subcutaneous tissue, unspecified: Secondary | ICD-10-CM | POA: Diagnosis present

## 2013-08-21 HISTORY — DX: Heart failure, unspecified: I50.9

## 2013-08-21 HISTORY — DX: Type 2 diabetes mellitus without complications: E11.9

## 2013-08-21 HISTORY — DX: Diverticulitis of intestine, part unspecified, without perforation or abscess without bleeding: K57.92

## 2013-08-21 HISTORY — DX: Perforation of intestine (nontraumatic): K63.1

## 2013-08-21 HISTORY — DX: Essential (primary) hypertension: I10

## 2013-08-21 HISTORY — DX: Calculus of kidney: N20.0

## 2013-08-21 LAB — COMPREHENSIVE METABOLIC PANEL
ALT: 17 U/L (ref 0–53)
AST: 21 U/L (ref 0–37)
Albumin: 3.5 g/dL (ref 3.5–5.2)
Alkaline Phosphatase: 110 U/L (ref 39–117)
BUN: 18 mg/dL (ref 6–23)
CO2: 30 mEq/L (ref 19–32)
Calcium: 9.7 mg/dL (ref 8.4–10.5)
GFR calc Af Amer: 73 mL/min — ABNORMAL LOW (ref >90)
GFR calc non Af Amer: 63 mL/min — ABNORMAL LOW (ref >90)
Glucose, Bld: 122 mg/dL — ABNORMAL HIGH (ref 70–99)
Sodium: 138 mEq/L (ref 135–145)
Total Protein: 7.5 g/dL (ref 6.0–8.3)

## 2013-08-21 LAB — CBC WITH DIFFERENTIAL/PLATELET
Basophils Absolute: 0 K/uL (ref 0.0–0.1)
Basophils Relative: 0 % (ref 0–1)
Eosinophils Absolute: 0.2 10*3/uL (ref 0.0–0.7)
Eosinophils Relative: 2 % (ref 0–5)
HCT: 45.8 % (ref 39.0–52.0)
Hemoglobin: 15.4 g/dL (ref 13.0–17.0)
Lymphocytes Relative: 21 % (ref 12–46)
Lymphs Abs: 1.8 10*3/uL (ref 0.7–4.0)
MCH: 27.8 pg (ref 26.0–34.0)
MCHC: 33.6 g/dL (ref 30.0–36.0)
MCV: 82.7 fL (ref 78.0–100.0)
Monocytes Absolute: 0.6 K/uL (ref 0.1–1.0)
Monocytes Relative: 6 % (ref 3–12)
Neutro Abs: 6 10*3/uL (ref 1.7–7.7)
Neutrophils Relative %: 70 % (ref 43–77)
Platelets: 244 10*3/uL (ref 150–400)
RBC: 5.54 MIL/uL (ref 4.22–5.81)
RDW: 13.9 % (ref 11.5–15.5)
WBC: 8.6 10*3/uL (ref 4.0–10.5)

## 2013-08-21 LAB — URINE MICROSCOPIC-ADD ON

## 2013-08-21 LAB — URINALYSIS, ROUTINE W REFLEX MICROSCOPIC
Bilirubin Urine: NEGATIVE
Glucose, UA: NEGATIVE mg/dL
Hgb urine dipstick: NEGATIVE
Ketones, ur: NEGATIVE mg/dL
Leukocytes, UA: NEGATIVE
Nitrite: NEGATIVE
Protein, ur: 30 mg/dL — AB
Specific Gravity, Urine: 1.028 (ref 1.005–1.030)
Urobilinogen, UA: 0.2 mg/dL (ref 0.0–1.0)
pH: 6 (ref 5.0–8.0)

## 2013-08-21 LAB — COMPREHENSIVE METABOLIC PANEL WITH GFR
Chloride: 96 meq/L (ref 96–112)
Creatinine, Ser: 1.31 mg/dL (ref 0.50–1.35)
Potassium: 3.7 meq/L (ref 3.5–5.1)
Total Bilirubin: 0.4 mg/dL (ref 0.3–1.2)

## 2013-08-21 LAB — LIPASE, BLOOD: Lipase: 46 U/L (ref 11–59)

## 2013-08-21 MED ORDER — HYDROMORPHONE HCL PF 1 MG/ML IJ SOLN
1.0000 mg | Freq: Once | INTRAMUSCULAR | Status: AC
  Administered 2013-08-21: 1 mg via INTRAVENOUS
  Filled 2013-08-21: qty 1

## 2013-08-21 MED ORDER — ALBUTEROL SULFATE (5 MG/ML) 0.5% IN NEBU
5.0000 mg | INHALATION_SOLUTION | Freq: Once | RESPIRATORY_TRACT | Status: AC
  Administered 2013-08-22: 5 mg via RESPIRATORY_TRACT
  Filled 2013-08-21: qty 1

## 2013-08-21 MED ORDER — IPRATROPIUM BROMIDE 0.02 % IN SOLN
0.5000 mg | Freq: Once | RESPIRATORY_TRACT | Status: AC
  Administered 2013-08-22: 0.5 mg via RESPIRATORY_TRACT
  Filled 2013-08-21: qty 2.5

## 2013-08-21 MED ORDER — OXYCODONE-ACETAMINOPHEN 5-325 MG PO TABS
1.0000 | ORAL_TABLET | Freq: Once | ORAL | Status: AC
  Administered 2013-08-21: 1 via ORAL
  Filled 2013-08-21: qty 1

## 2013-08-21 MED ORDER — ONDANSETRON HCL 4 MG/2ML IJ SOLN
4.0000 mg | Freq: Once | INTRAMUSCULAR | Status: AC
  Administered 2013-08-21: 4 mg via INTRAVENOUS
  Filled 2013-08-21: qty 2

## 2013-08-21 NOTE — ED Provider Notes (Signed)
CSN: [id number]     Arrival date & time 08/21/13  1910 History   First MD Initiated Contact with Patient 08/21/13 2147     Chief Complaint  Patient presents with  . Nephrolithiasis   (Consider location/radiation/quality/duration/timing/severity/associated sxs/prior Treatment) HPI Comments: Pt with h/o diverticulitis causing intestinal perforation and is s/p ostomy and then ostomy take down in the past.  Pt has had numerous kidney stones in the past and is worried he has another one.    Patient is a 47 y.o. male presenting with flank pain. The history is provided by the patient, medical records and a relative.  Flank Pain This is a recurrent problem. The current episode started more than 1 week ago. Associated symptoms include abdominal pain and shortness of breath. Pertinent negatives include no chest pain. The symptoms are relieved by narcotics.    Past Medical History  Diagnosis Date  . Kidney stones   . Hypertension   . Diabetes mellitus without complication   . CHF (congestive heart failure)    Past Surgical History  Procedure Laterality Date  . Kidney stone surgery    . Colon surgery     No family history on file. History  Substance Use Topics  . Smoking status: Former Games developer  . Smokeless tobacco: Former Neurosurgeon    Quit date: 08/22/1999  . Alcohol Use: Yes     Comment: Socially    Review of Systems  Constitutional: Negative for fever, chills and appetite change.  Respiratory: Positive for shortness of breath. Negative for cough.   Cardiovascular: Negative for chest pain.  Gastrointestinal: Positive for nausea, vomiting and abdominal pain. Negative for diarrhea and constipation.  Genitourinary: Positive for flank pain.  Musculoskeletal: Negative for back pain.  Skin: Negative for rash and wound.  All other systems reviewed and are negative.    Allergies  Review of patient's allergies indicates no known allergies.  Home Medications   Current Outpatient Rx  Name   Route  Sig  Dispense  Refill  . FLUoxetine (PROZAC) 20 MG capsule   Oral   Take 20 mg by mouth daily.         . furosemide (LASIX) 20 MG tablet   Oral   Take 20 mg by mouth daily.         . hydrALAZINE (APRESOLINE) 100 MG tablet   Oral   Take 100 mg by mouth 2 (two) times daily.         Marland Kitchen lisinopril (PRINIVIL,ZESTRIL) 40 MG tablet   Oral   Take 40 mg by mouth daily.         Marland Kitchen LORazepam (ATIVAN) 0.5 MG tablet   Oral   Take 0.5 mg by mouth 2 (two) times daily as needed for anxiety.         . metFORMIN (GLUCOPHAGE) 500 MG tablet   Oral   Take 500 mg by mouth 2 (two) times daily with a meal.         . metoprolol (LOPRESSOR) 100 MG tablet   Oral   Take 100 mg by mouth 2 (two) times daily.         Marland Kitchen oxyCODONE-acetaminophen (PERCOCET) 10-325 MG per tablet   Oral   Take 1 tablet by mouth every 4 (four) hours as needed for pain.         Marland Kitchen testosterone cypionate (DEPOTESTOTERONE CYPIONATE) 100 MG/ML injection   Intramuscular   Inject 100 mg into the muscle every 14 (fourteen) days. For IM use only  BP 185/96  Pulse 87  Temp(Src) 98.4 F (36.9 C) (Oral)  Resp 20  Ht 5' 8.5" (1.74 m)  Wt 320 lb (145.151 kg)  BMI 47.94 kg/m2  SpO2 93% Physical Exam  Nursing note and vitals reviewed. Constitutional: He is oriented to person, place, and time. He appears well-developed and well-nourished. No distress.  HENT:  Head: Normocephalic and atraumatic.  Eyes: EOM are normal.  Neck: Neck supple.  Cardiovascular: Normal rate, regular rhythm and intact distal pulses.   No murmur heard. Pulmonary/Chest: Effort normal. No respiratory distress. He has no wheezes. He has no rales.  Abdominal: Soft. He exhibits no distension. There is no tenderness. There is no rebound.  Neurological: He is alert and oriented to person, place, and time.  Skin: Skin is warm. No rash noted. He is not diaphoretic.  Psychiatric: He has a normal mood and affect.    ED Course   Procedures (including critical care time) Labs Review Labs Reviewed  URINALYSIS, ROUTINE W REFLEX MICROSCOPIC - Abnormal; Notable for the following:    APPearance CLOUDY (*)    Protein, ur 30 (*)    All other components within normal limits  COMPREHENSIVE METABOLIC PANEL - Abnormal; Notable for the following:    Glucose, Bld 122 (*)    GFR calc non Af Amer 63 (*)    GFR calc Af Amer 73 (*)    All other components within normal limits  URINE MICROSCOPIC-ADD ON - Abnormal; Notable for the following:    Squamous Epithelial / LPF FEW (*)    Casts HYALINE CASTS (*)    All other components within normal limits  PRO B NATRIURETIC PEPTIDE - Abnormal; Notable for the following:    Pro B Natriuretic peptide (BNP) 1325.0 (*)    All other components within normal limits  POCT I-STAT 3, BLOOD GAS (G3+) - Abnormal; Notable for the following:    pCO2 arterial 49.1 (*)    pO2, Arterial 58.0 (*)    Bicarbonate 31.2 (*)    Acid-Base Excess 5.0 (*)    All other components within normal limits  CBC WITH DIFFERENTIAL  LIPASE, BLOOD  TROPONIN I     RA sat is 92% and I interpret to be low normal  Imaging Review Ct Abdomen Pelvis Wo Contrast  08/21/2013   CLINICAL DATA:  Nephrolithiasis. Left flank pain  EXAM: CT ABDOMEN AND PELVIS WITHOUT CONTRAST  TECHNIQUE: Multidetector CT imaging of the abdomen and pelvis was performed following the standard protocol without intravenous contrast.  COMPARISON:  CT 07/08/2010  FINDINGS: Nonobstructing small renal calculi on the left. 2 mm stones are present left upper, mid, and lower pole. No ureteral calculi. No right renal calculi. 2 cm right renal cyst.  1 cm hypodensity right lobe of the liver posteriorly is indeterminate but probably benign. This is not seen on the prior study however there was significant streak artifact through the liver. Pancreas and spleen are normal.  Negative for bowel obstruction. Appendix is normal. No free fluid, mass, or adenopathy.   2 cm pleural based density right lateral lung base, not definitely confirmed on prior studies. This has soft tissue density. CT chest is recommended for further evaluation.  IMPRESSION: 3 nonobstructive stones left kidney.  No ureteral calculi.  2 cm pleural based density right lateral lung base. Recommend chest CT for further evaluation.   Electronically Signed   By: Marlan Palau M.D.   On: 08/21/2013 23:23   Dg Chest 2 View  08/22/2013  CLINICAL DATA:  Short of breath, hypoxemia  EXAM: CHEST  2 VIEW  COMPARISON:  09/12/2010. CT abdomen today 8  FINDINGS: Abnormal density on the right. There is a probable right upper lobe mass measuring approximately 3 cm with right peritracheal adenopathy. There is right pleural thickening. These findings were not present in 2011. No layering effusion.  Cardiac enlargement without heart failure.  Left lung clear.  IMPRESSION: Right lung density with right peritracheal adenopathy. Findings are worrisome for carcinoma of the lung. CT chest with contrast recommended for further evaluation.   Electronically Signed   By: Marlan Palau M.D.   On: 08/22/2013 00:35    EKG Interpretation     Ventricular Rate:  79 PR Interval:  158 QRS Duration: 80 QT Interval:  374 QTC Calculation: 429 R Axis:   55 Text Interpretation:  Sinus rhythm Abnormal T, consider ischemia, lateral leads Abnormal ECG No significant change since last tracing           11:59 PM CT shows no obstructing stone.  Discussed findings with pt.  While in the room, pt would desaturation to 89% on RA.  Pt doesn't use home O2, quit smoking 14 years ago.  Does have a h/o CHF managed just by PCP.  He has had a mild cough recently.  Pt is quite obese and reports a h/o sleep apnea.  He endorses SOB, but mainly with exertion, states SOB may be a little worse over the past several months.  I suspect due to being given narcotics with sleep apnea and possibly some CHF, likely cause for desaturation.  Will  get ABG, give neb treatment and obtain CXR as well.  ECG performed as well.  No new ischemic changes compared to prior.     12:53 AM BNP is elevated. CXR shows abn, worrisome for carcinoma.  Will get ABG given continued hypoxemia, relative and obtain contrast chest CT and possibly admit.  Will sign out to Dr. Lavella Lemons.     MDM   1. Flank pain   2. Kidney stones   3. Hypoxia      Pt with non obstructive kidney stones.  No evidence of infection on UA.  Discussed with Dr. Chestine Spore with radiology, can get CT of chest as outpt for follow up.  No diverticulitis seen on CT scan.  Will give Rx for analgesic and refer to PCP for chest CT follow up and with urology regarding ureteral pain that has been persistent.      Gavin Pound. Oletta Lamas, MD 08/22/13 [phone]

## 2013-08-21 NOTE — ED Notes (Addendum)
Pt presents with lower back and left flank pain.  Pt states that he has had multiple kidney stones in the past and this seems similar.  Pt also states he has had intermitted nausea for two weeks as well

## 2013-08-22 ENCOUNTER — Encounter (HOSPITAL_COMMUNITY): Payer: Self-pay | Admitting: Radiology

## 2013-08-22 ENCOUNTER — Emergency Department (HOSPITAL_COMMUNITY): Payer: Medicaid Other

## 2013-08-22 DIAGNOSIS — L989 Disorder of the skin and subcutaneous tissue, unspecified: Secondary | ICD-10-CM | POA: Diagnosis present

## 2013-08-22 DIAGNOSIS — R918 Other nonspecific abnormal finding of lung field: Secondary | ICD-10-CM | POA: Diagnosis present

## 2013-08-22 DIAGNOSIS — E119 Type 2 diabetes mellitus without complications: Secondary | ICD-10-CM | POA: Diagnosis present

## 2013-08-22 DIAGNOSIS — R0902 Hypoxemia: Secondary | ICD-10-CM | POA: Diagnosis present

## 2013-08-22 DIAGNOSIS — E669 Obesity, unspecified: Secondary | ICD-10-CM | POA: Diagnosis present

## 2013-08-22 DIAGNOSIS — I10 Essential (primary) hypertension: Secondary | ICD-10-CM | POA: Diagnosis present

## 2013-08-22 LAB — COMPREHENSIVE METABOLIC PANEL
AST: 16 U/L (ref 0–37)
Albumin: 3.5 g/dL (ref 3.5–5.2)
CO2: 30 mEq/L (ref 19–32)
Calcium: 9.4 mg/dL (ref 8.4–10.5)
Creatinine, Ser: 1.16 mg/dL (ref 0.50–1.35)
GFR calc Af Amer: 84 mL/min — ABNORMAL LOW (ref >90)
GFR calc non Af Amer: 73 mL/min — ABNORMAL LOW (ref >90)
Glucose, Bld: 112 mg/dL — ABNORMAL HIGH (ref 70–99)

## 2013-08-22 LAB — POCT I-STAT 3, ART BLOOD GAS (G3+)
Acid-Base Excess: 5 mmol/L — ABNORMAL HIGH (ref 0.0–2.0)
Bicarbonate: 31.2 mEq/L — ABNORMAL HIGH (ref 20.0–24.0)
O2 Saturation: 89 %
TCO2: 33 mmol/L (ref 0–100)
pCO2 arterial: 49.1 mmHg — ABNORMAL HIGH (ref 35.0–45.0)
pH, Arterial: 7.411 (ref 7.350–7.450)
pO2, Arterial: 58 mmHg — ABNORMAL LOW (ref 80.0–100.0)

## 2013-08-22 LAB — CBC
Hemoglobin: 14.7 g/dL (ref 13.0–17.0)
MCH: 27.7 pg (ref 26.0–34.0)
RBC: 5.3 MIL/uL (ref 4.22–5.81)

## 2013-08-22 LAB — GLUCOSE, CAPILLARY
Glucose-Capillary: 114 mg/dL — ABNORMAL HIGH (ref 70–99)
Glucose-Capillary: 114 mg/dL — ABNORMAL HIGH (ref 70–99)
Glucose-Capillary: 114 mg/dL — ABNORMAL HIGH (ref 70–99)
Glucose-Capillary: 137 mg/dL — ABNORMAL HIGH (ref 70–99)

## 2013-08-22 LAB — TROPONIN I: Troponin I: <0.3 ng/mL (ref <0.30)

## 2013-08-22 LAB — HEMOGLOBIN A1C: Mean Plasma Glucose: 126 mg/dL — ABNORMAL HIGH (ref <117)

## 2013-08-22 LAB — PRO B NATRIURETIC PEPTIDE: Pro B Natriuretic peptide (BNP): 1325 pg/mL — ABNORMAL HIGH (ref 0–125)

## 2013-08-22 MED ORDER — FLUOXETINE HCL 20 MG PO CAPS
20.0000 mg | ORAL_CAPSULE | Freq: Every day | ORAL | Status: DC
  Administered 2013-08-22: 20 mg via ORAL
  Filled 2013-08-22 (×2): qty 1

## 2013-08-22 MED ORDER — MAGNESIUM HYDROXIDE 400 MG/5ML PO SUSP
30.0000 mL | Freq: Once | ORAL | Status: AC
  Administered 2013-08-22: 30 mL via ORAL
  Filled 2013-08-22: qty 30

## 2013-08-22 MED ORDER — OXYCODONE-ACETAMINOPHEN 10-325 MG PO TABS: 1.0000 | ORAL_TABLET | ORAL | Status: DC | PRN

## 2013-08-22 MED ORDER — PRO-STAT SUGAR FREE PO LIQD
30.0000 mL | Freq: Two times a day (BID) | ORAL | Status: DC
  Administered 2013-08-22: 19:00:00 30 mL via ORAL
  Filled 2013-08-22 (×3): qty 30

## 2013-08-22 MED ORDER — LISINOPRIL 40 MG PO TABS
40.0000 mg | ORAL_TABLET | Freq: Every day | ORAL | Status: DC
  Administered 2013-08-22: 10:00:00 40 mg via ORAL
  Filled 2013-08-22 (×2): qty 1

## 2013-08-22 MED ORDER — FUROSEMIDE 10 MG/ML IJ SOLN: 40.0000 mg | Freq: Once | INTRAMUSCULAR | Status: DC

## 2013-08-22 MED ORDER — OXYCODONE HCL 5 MG PO TABS
5.0000 mg | ORAL_TABLET | ORAL | Status: DC | PRN
  Administered 2013-08-22 (×2): 5 mg via ORAL
  Filled 2013-08-22 (×2): qty 1

## 2013-08-22 MED ORDER — ONDANSETRON HCL 4 MG PO TABS: 4.0000 mg | ORAL_TABLET | Freq: Four times a day (QID) | ORAL | Status: DC | PRN

## 2013-08-22 MED ORDER — IOHEXOL 300 MG/ML  SOLN
100.0000 mL | Freq: Once | INTRAMUSCULAR | Status: AC | PRN
  Administered 2013-08-22: 100 mL via INTRAVENOUS

## 2013-08-22 MED ORDER — MORPHINE SULFATE 2 MG/ML IJ SOLN
2.0000 mg | INTRAMUSCULAR | Status: DC | PRN
  Administered 2013-08-22: 2 mg via INTRAVENOUS
  Administered 2013-08-22 (×2): 4 mg via INTRAVENOUS
  Administered 2013-08-22: 2 mg via INTRAVENOUS
  Administered 2013-08-22 – 2013-08-23 (×2): 4 mg via INTRAVENOUS
  Filled 2013-08-22: qty 2
  Filled 2013-08-22: qty 1
  Filled 2013-08-22: qty 2
  Filled 2013-08-22: qty 1
  Filled 2013-08-22 (×2): qty 2

## 2013-08-22 MED ORDER — ENOXAPARIN SODIUM 40 MG/0.4ML ~~LOC~~ SOLN
40.0000 mg | SUBCUTANEOUS | Status: DC
  Administered 2013-08-22: 40 mg via SUBCUTANEOUS
  Filled 2013-08-22 (×2): qty 0.4

## 2013-08-22 MED ORDER — METOPROLOL TARTRATE 100 MG PO TABS
100.0000 mg | ORAL_TABLET | Freq: Two times a day (BID) | ORAL | Status: DC
  Administered 2013-08-22 (×2): 100 mg via ORAL
  Filled 2013-08-22 (×4): qty 1

## 2013-08-22 MED ORDER — ALBUTEROL SULFATE (5 MG/ML) 0.5% IN NEBU: 2.5000 mg | INHALATION_SOLUTION | RESPIRATORY_TRACT | Status: DC | PRN

## 2013-08-22 MED ORDER — ENOXAPARIN SODIUM 80 MG/0.8ML ~~LOC~~ SOLN
70.0000 mg | SUBCUTANEOUS | Status: DC
  Filled 2013-08-22: qty 0.8

## 2013-08-22 MED ORDER — PNEUMOCOCCAL VAC POLYVALENT 25 MCG/0.5ML IJ INJ
0.5000 mL | INJECTION | INTRAMUSCULAR | Status: DC
  Filled 2013-08-22: qty 0.5

## 2013-08-22 MED ORDER — ACETAMINOPHEN 650 MG RE SUPP: 650.0000 mg | Freq: Four times a day (QID) | RECTAL | Status: DC | PRN

## 2013-08-22 MED ORDER — HYDROMORPHONE HCL PF 1 MG/ML IJ SOLN
1.0000 mg | Freq: Once | INTRAMUSCULAR | Status: AC
  Administered 2013-08-22: 1 mg via INTRAVENOUS
  Filled 2013-08-22: qty 1

## 2013-08-22 MED ORDER — ACETAMINOPHEN 325 MG PO TABS
650.0000 mg | ORAL_TABLET | Freq: Four times a day (QID) | ORAL | Status: DC | PRN
  Administered 2013-08-22: 650 mg via ORAL
  Filled 2013-08-22: qty 2

## 2013-08-22 MED ORDER — HYDRALAZINE HCL 20 MG/ML IJ SOLN
5.0000 mg | INTRAMUSCULAR | Status: DC | PRN
  Administered 2013-08-22 – 2013-08-23 (×4): 5 mg via INTRAVENOUS
  Filled 2013-08-22 (×4): qty 1

## 2013-08-22 MED ORDER — INFLUENZA VAC SPLIT QUAD 0.5 ML IM SUSP
0.5000 mL | INTRAMUSCULAR | Status: DC
  Filled 2013-08-22: qty 0.5

## 2013-08-22 MED ORDER — NITROGLYCERIN 0.4 MG SL SUBL: 0.4000 mg | SUBLINGUAL_TABLET | SUBLINGUAL | Status: DC | PRN

## 2013-08-22 MED ORDER — FUROSEMIDE 20 MG PO TABS
20.0000 mg | ORAL_TABLET | Freq: Every day | ORAL | Status: DC
  Administered 2013-08-22: 20 mg via ORAL
  Filled 2013-08-22 (×2): qty 1

## 2013-08-22 MED ORDER — INSULIN ASPART 100 UNIT/ML ~~LOC~~ SOLN
0.0000 [IU] | SUBCUTANEOUS | Status: DC
  Administered 2013-08-22: 1 [IU] via SUBCUTANEOUS
  Administered 2013-08-22: 18:00:00 3 [IU] via SUBCUTANEOUS
  Administered 2013-08-23: 2 [IU] via SUBCUTANEOUS
  Administered 2013-08-23: 04:00:00 1 [IU] via SUBCUTANEOUS

## 2013-08-22 MED ORDER — OXYCODONE-ACETAMINOPHEN 5-325 MG PO TABS
1.0000 | ORAL_TABLET | ORAL | Status: DC | PRN
  Administered 2013-08-22: 1 via ORAL
  Filled 2013-08-22: qty 1

## 2013-08-22 MED ORDER — LORAZEPAM 0.5 MG PO TABS: 0.5000 mg | ORAL_TABLET | Freq: Two times a day (BID) | ORAL | Status: DC | PRN

## 2013-08-22 MED ORDER — ONDANSETRON HCL 4 MG/2ML IJ SOLN
4.0000 mg | Freq: Four times a day (QID) | INTRAMUSCULAR | Status: DC | PRN
  Administered 2013-08-22: 4 mg via INTRAVENOUS
  Filled 2013-08-22 (×2): qty 2

## 2013-08-22 MED ORDER — DOCUSATE SODIUM 100 MG PO CAPS
100.0000 mg | ORAL_CAPSULE | Freq: Two times a day (BID) | ORAL | Status: DC
  Administered 2013-08-22 (×2): 100 mg via ORAL
  Filled 2013-08-22 (×4): qty 1

## 2013-08-22 MED ORDER — HYDRALAZINE HCL 100 MG PO TABS
100.0000 mg | ORAL_TABLET | Freq: Two times a day (BID) | ORAL | Status: DC
  Administered 2013-08-22 (×2): 100 mg via ORAL
  Filled 2013-08-22 (×4): qty 1

## 2013-08-22 MED ORDER — SODIUM CHLORIDE 0.9 % IV SOLN
Freq: Once | INTRAVENOUS | Status: AC
  Administered 2013-08-22: 01:00:00 via INTRAVENOUS

## 2013-08-22 NOTE — Progress Notes (Signed)
[facility] PROGRESS NOTE  Jeffrey Benitez ZOX:[id number] DOB: June 13, 1966 DOA: 08/21/2013 PCP: Neldon Labella, MD  Assessment/Plan: 47 yo male with history of DM, CHF, HTN, diverticular perf, kidney stones, and morbid obesity that presented to the ER with back pain.  CT scan of chest and abdomen that revealed two lung masses, one on the right and one on the left with lymphadenopathy.  He was subsequently admitted and on exam there was a suspicious nevus on his lower back.  Back Pain - Secondary to lung masses.  -Treat supportively, pain well controlled with meds.  Mass of Lung -Likely lung cancer given hx of smoking -Bronchoscopy scheduled for 10/21; pulmonology consulted -No longer requiring oxygen, sats 94  CHF -order 2d echo -continue home dose lasix, lisinopril, and metoprolol -currently stable - Ins and outs daily and weights daily by nursing  DM -CBGs are stable -on SSI-S while inpatient -pt on metformin while at home  HTN -poorly controlled htn likely due to pain -continuing home meds -added prn hydralazine   Skin Lesion -concern for melanoma -attempting to gain dermatology consultation for biopsy  Morbid Obesity -nutrition consult needed      Code Status: full Family Communication: mother at bedside Disposition Plan: inpatient  Consultants:  Pulmonology   Procedures:  Bronchoscopy scheduled for later 10/21  Antibiotics:  none  HPI/Subjective: Pt states that he is still having back pain and that he hasn't had a bowel movement in a very long time. No other complaints.  Objective: Filed Vitals:   08/22/13 1020  BP: 194/112  Pulse: 85  Temp: 98.6 F (37 C)  Resp: 20    Intake/Output Summary (Last 24 hours) at 08/22/13 1114 Last data filed at 08/22/13 0842  Gross per 24 hour  Intake      0 ml  Output      0 ml  Net      0 ml   Filed Weights   08/21/13 1916 08/22/13 0551  Weight: 145.151 kg (320 lb) 145.151 kg (320 lb)     Exam:   General:  Sitting upright, alert and oriented, no acute distress  Cardiovascular: RRR, no m/g/r, no extremity edema  Respiratory: clear to auscultation bilaterally  Abdomen: large panus, positive bowel sounds, soft to palpation, no pain  Musculoskeletal: all extremities mobile, with normal muscle tone and strength  Skin: approx. 1 cm nevus mid back at level of approx L4 with blue black color changes suggestive of melanoma   Data Reviewed: Basic Metabolic Panel:  Recent Labs Lab 08/21/13 2220 08/22/13 0622  NA 138 137  K 3.7 3.7  CL 96 97  CO2 30 30  GLUCOSE 122* 112*  BUN 18 17  CREATININE 1.31 1.16  CALCIUM 9.7 9.4  MG  --  1.9  PHOS  --  3.5   Liver Function Tests:  Recent Labs Lab 08/21/13 2220 08/22/13 0622  AST 21 16  ALT 17 11  ALKPHOS 110 111  BILITOT 0.4 0.6  PROT 7.5 7.2  ALBUMIN 3.5 3.5    Recent Labs Lab 08/21/13 2220  LIPASE 46   CBC:  Recent Labs Lab 08/21/13 2220 08/22/13 0622  WBC 8.6 9.0  NEUTROABS 6.0  --   HGB 15.4 14.7  HCT 45.8 44.4  MCV 82.7 83.8  PLT 244 240   Cardiac Enzymes:  Recent Labs Lab 08/21/13 2220  TROPONINI <0.30   BNP (last 3 results)  Recent Labs  08/21/13 2220  PROBNP 1325.0*   CBG:  Recent  Labs Lab 08/22/13 0628 08/22/13 0803  GLUCAP 114* 114*       Studies: Ct Abdomen Pelvis Wo Contrast  08/21/2013   CLINICAL DATA:  Nephrolithiasis. Left flank pain  EXAM: CT ABDOMEN AND PELVIS WITHOUT CONTRAST  TECHNIQUE: Multidetector CT imaging of the abdomen and pelvis was performed following the standard protocol without intravenous contrast.  COMPARISON:  CT 07/08/2010  FINDINGS: Nonobstructing small renal calculi on the left. 2 mm stones are present left upper, mid, and lower pole. No ureteral calculi. No right renal calculi. 2 cm right renal cyst.  1 cm hypodensity right lobe of the liver posteriorly is indeterminate but probably benign. This is not seen on the prior study however  there was significant streak artifact through the liver. Pancreas and spleen are normal.  Negative for bowel obstruction. Appendix is normal. No free fluid, mass, or adenopathy.  2 cm pleural based density right lateral lung base, not definitely confirmed on prior studies. This has soft tissue density. CT chest is recommended for further evaluation.  IMPRESSION: 3 nonobstructive stones left kidney.  No ureteral calculi.  2 cm pleural based density right lateral lung base. Recommend chest CT for further evaluation.   Electronically Signed   By: Marlan Palau M.D.   On: 08/21/2013 23:23   Dg Chest 2 View  08/22/2013   CLINICAL DATA:  Short of breath, hypoxemia  EXAM: CHEST  2 VIEW  COMPARISON:  09/12/2010. CT abdomen today 8  FINDINGS: Abnormal density on the right. There is a probable right upper lobe mass measuring approximately 3 cm with right peritracheal adenopathy. There is right pleural thickening. These findings were not present in 2011. No layering effusion.  Cardiac enlargement without heart failure.  Left lung clear.  IMPRESSION: Right lung density with right peritracheal adenopathy. Findings are worrisome for carcinoma of the lung. CT chest with contrast recommended for further evaluation.   Electronically Signed   By: Marlan Palau M.D.   On: 08/22/2013 00:35   Ct Chest W Contrast  08/22/2013   CLINICAL DATA:  Shortness of breath. Chest mass on x-ray.  EXAM: CT CHEST WITH CONTRAST  TECHNIQUE: Multidetector CT imaging of the chest was performed during intravenous contrast administration.  CONTRAST:  OMNIPAQUE IOHEXOL 300 MG/ML  SOLN  COMPARISON:  Chest radiograph 08/22/2013  FINDINGS: Multiple bilateral pulmonary masses. The largest mass is in the right upper lung anteriorly, measuring 5.2 x 5.4 cm. Multiple additional right lung masses are present, measuring up to 4.7 x 2.9 cm diameter. A single mass is demonstrated in the left upper lung posteriorly, measuring 1.5 cm in diameter. The  findings are consistent with primary and/or metastatic disease.  Right hilar lymphadenopathy with lymph nodes measuring up to about 3.8 x 2.5 cm. Subcarinal lymphadenopathy and right pretracheal lymphadenopathy is also present.  Normal heart size. Normal caliber thoracic aorta. Coronary artery calcification. The esophagus is decompressed. No pleural effusions. No pneumothorax.  Visualized portions of the upper abdominal organs demonstrate mild fatty infiltration of the liver.  Degenerative changes in the thoracic spine. No destructive bone lesions are appreciated.  IMPRESSION: Right upper lung mass measuring up to 5.4 cm diameter with multiple additional right lung mass is and a single left lung nodule. Lymphadenopathy in the right hilum and mediastinum. Changes are consistent with primary and/ or metastatic disease.   Electronically Signed   By: Burman Nieves M.D.   On: 08/22/2013 02:19    Scheduled Meds: . docusate sodium  100 mg Oral BID  .  enoxaparin (LOVENOX) injection  40 mg Subcutaneous Q24H  . FLUoxetine  20 mg Oral Daily  . furosemide  20 mg Oral Daily  . hydrALAZINE  100 mg Oral BID  . [START ON 08/23/2013] influenza vac split quadrivalent PF  0.5 mL Intramuscular Tomorrow-1000  . insulin aspart  0-9 Units Subcutaneous Q4H  . lisinopril  40 mg Oral Daily  . metoprolol  100 mg Oral BID  . [START ON 08/23/2013] pneumococcal 23 valent vaccine  0.5 mL Intramuscular Tomorrow-1000   Continuous Infusions:   Principal Problem:   Mass of lung Active Problems:   Obesity   Hypertension   Diabetes mellitus   Hypoxia   CHF, chronic   Skin lesion of back        Stephani Police, PA-C Dorinda Hill, PA-S  [facility] Pager [phone]. If 7PM-7AM, please contact night-coverage at [email or web address], password [facility] 08/22/2013, 11:14 AM  LOS: 1 day

## 2013-08-22 NOTE — ED Provider Notes (Signed)
0345: CT chest shows bilateral pulmonary masses - right > left. I have discussed results of study and the remainder of ED work up with the patient.   Brandt Loosen, MD 08/22/13 [phone]

## 2013-08-22 NOTE — H&P (Signed)
PCP:  Neldon Labella, MD    Chief Complaint:  Back pain  HPI: Jeffrey Benitez is a 47 y.o. male   has a past medical history of Kidney stones; Hypertension; Diabetes mellitus without complication; CHF (congestive heart failure); Perforated sigmoid colon; and Diverticulitis.   Presented with  patient has hx of kidney stones and came to ER due to back pain for the past 3 weeks wanting to be evaluated for that. CT of the abdomen did not show any obstructing stone but was worrisome for lung nodule. CT of the chest was done that showed lesion worrisome for primary carcinoma and metastatic disease. Describes decreased appetite with weight loss, denies any cough. Denies chest pain or shortness of breath. He was noted to be hypoxic on room air and was put on 2 L of oxygen.  Back pain is worse with movement.   Review of Systems:    Pertinent positives include: back pain, weight loss, nausea,  back pain.  Constitutional:  No weight loss, night sweats, Fevers, chills, fatigue,   HEENT:  No headaches, Difficulty swallowing,Tooth/dental problems,Sore throat,  No sneezing, itching, ear ache, nasal congestion, post nasal drip,  Cardio-vascular:  No chest pain, Orthopnea, PND, anasarca, dizziness, palpitations.no Bilateral lower extremity swelling  GI:  No heartburn, indigestion, abdominal pain, vomiting, diarrhea, change in bowel habits, loss of appetite, melena, blood in stool, hematemesis Resp:  no shortness of breath at rest. No dyspnea on exertion, No excess mucus, no productive cough, No non-productive cough, No coughing up of blood.No change in color of mucus.No wheezing. Skin:  no rash or lesions. No jaundice GU:  no dysuria, change in color of urine, no urgency or frequency. No straining to urinate.  No flank pain.  Musculoskeletal:  No joint pain or no joint swelling. No decreased range of motion. No  Psych:  No change in mood or affect. No depression or anxiety. No memory loss.   Neuro: no localizing neurological complaints, no tingling, no weakness, no double vision, no gait abnormality, no slurred speech, no confusion  Otherwise ROS are negative except for above, 10 systems were reviewed  Past Medical History: Past Medical History  Diagnosis Date  . Kidney stones   . Hypertension   . Diabetes mellitus without complication   . CHF (congestive heart failure)   . Perforated sigmoid colon   . Diverticulitis    Past Surgical History  Procedure Laterality Date  . Kidney stone surgery    . Colon surgery    . Colon ressection       Medications: Prior to Admission medications   Medication Sig Start Date End Date Taking? Authorizing Provider  FLUoxetine (PROZAC) 20 MG capsule Take 20 mg by mouth daily.   Yes Historical Provider, MD  furosemide (LASIX) 20 MG tablet Take 20 mg by mouth daily.   Yes Historical Provider, MD  hydrALAZINE (APRESOLINE) 100 MG tablet Take 100 mg by mouth 2 (two) times daily.   Yes Historical Provider, MD  lisinopril (PRINIVIL,ZESTRIL) 40 MG tablet Take 40 mg by mouth daily.   Yes Historical Provider, MD  LORazepam (ATIVAN) 0.5 MG tablet Take 0.5 mg by mouth 2 (two) times daily as needed for anxiety.   Yes Historical Provider, MD  metFORMIN (GLUCOPHAGE) 500 MG tablet Take 500 mg by mouth 2 (two) times daily with a meal.   Yes Historical Provider, MD  metoprolol (LOPRESSOR) 100 MG tablet Take 100 mg by mouth 2 (two) times daily.   Yes Historical Provider, MD  oxyCODONE-acetaminophen (PERCOCET) 10-325 MG per tablet Take 1 tablet by mouth every 4 (four) hours as needed for pain.   Yes Historical Provider, MD  testosterone cypionate (DEPOTESTOTERONE CYPIONATE) 100 MG/ML injection Inject 100 mg into the muscle every 14 (fourteen) days. For IM use only   Yes Historical Provider, MD    Allergies:  No Known Allergies  Social History:  Ambulatory  independently  Lives at  home   reports that he has quit smoking. He quit smokeless tobacco  use about 14 years ago. He reports that he drinks alcohol. He reports that he does not use illicit drugs.   Family History: family history includes Diabetes type II in his father and mother; Heart disease in his father; Hypertension in his father.    Physical Exam: Patient Vitals for the past 24 hrs:  BP Temp Temp src Pulse Resp SpO2 Height Weight  08/22/13 0400 182/117 mmHg - - 85 26 94 % - -  08/22/13 0330 192/108 mmHg - - 80 14 95 % - -  08/22/13 0132 167/84 mmHg - - - 18 96 % - -  08/22/13 0035 - - - - - 93 % - -  08/21/13 2307 185/96 mmHg - - 87 - 92 % - -  08/21/13 2245 174/96 mmHg - - 83 - - - -  08/21/13 2215 178/93 mmHg - - 74 - 96 % - -  08/21/13 2152 177/101 mmHg 98.4 F (36.9 C) Oral 75 20 95 % - -  08/21/13 1916 104/53 mmHg 97.5 F (36.4 C) Oral 71 18 95 % 5' 8.5" (1.74 m) 145.151 kg (320 lb)    1. General:  in No Acute distress 2. Psychological: Alert and  Oriented 3. Head/ENT:   Moist  Mucous Membranes                          Head Non traumatic, neck supple                          Normal Dentition 4. SKIN: normal  Skin turgor,  Skin clean Dry and intact no rash  lower back significant for 12 mm lesion with irregular colors and contours 5. Heart: Regular rate and rhythm no Murmur, Rub or gallop 6. Lungs: Clear to auscultation bilaterally, no wheezes or crackles ,distant 7. Abdomen: Soft, non-tender, Non distended, obese 8. Lower extremities: no clubbing, cyanosis, or edema 9. Neurologically Grossly intact, moving all 4 extremities equally 10. MSK: Normal range of motion  body mass index is 47.94 kg/(m^2).   Labs on Admission:   Recent Labs  08/21/13 2220  NA 138  K 3.7  CL 96  CO2 30  GLUCOSE 122*  BUN 18  CREATININE 1.31  CALCIUM 9.7    Recent Labs  08/21/13 2220  AST 21  ALT 17  ALKPHOS 110  BILITOT 0.4  PROT 7.5  ALBUMIN 3.5    Recent Labs  08/21/13 2220  LIPASE 46    Recent Labs  08/21/13 2220  WBC 8.6  NEUTROABS 6.0  HGB  15.4  HCT 45.8  MCV 82.7  PLT 244    Recent Labs  08/21/13 2220  TROPONINI <0.30   No results found for this basename: TSH, T4TOTAL, FREET3, T3FREE, THYROIDAB,  in the last 72 hours No results found for this basename: VITAMINB12, FOLATE, FERRITIN, TIBC, IRON, RETICCTPCT,  in the last 72 hours Lab Results  Component Value Date  HGBA1C  Value: 6.1 (NOTE)                                                                       According to the ADA Clinical Practice Recommendations for 2011, when HbA1c is used as a screening test:   >=6.5%   Diagnostic of Diabetes Mellitus           (if abnormal result  is confirmed)  5.7-6.4%   Increased risk of developing Diabetes Mellitus  References:Diagnosis and Classification of Diabetes Mellitus,Diabetes Care,2011,34(Suppl 1):S62-S69 and Standards of Medical Care in         Diabetes - 2011,Diabetes Care,2011,34  (Suppl 1):S11-S61.* 07/09/2010    Estimated Creatinine Clearance: 97.3 ml/min (by C-G formula based on Cr of 1.31). ABG    Component Value Date/Time   PHART 7.411 08/22/2013 0050   HCO3 31.2* 08/22/2013 0050   TCO2 33 08/22/2013 0050   O2SAT 89.0 08/22/2013 0050     No results found for this basename: DDIMER     Other results:  I have pearsonaly reviewed this: ECG REPORT  Rate: 79  Rhythm: NSR ST&T Change: no evidence of ischemia  UA no evidence of infection   Cultures:    Component Value Date/Time   SDES URINE, CATHETERIZED 09/12/2010 1104   SPECREQUEST NORMAL 09/12/2010 1104   CULT NO GROWTH 09/12/2010 1104   REPTSTATUS 09/13/2010 FINAL 09/12/2010 1104       Radiological Exams on Admission: Ct Abdomen Pelvis Wo Contrast  08/21/2013   CLINICAL DATA:  Nephrolithiasis. Left flank pain  EXAM: CT ABDOMEN AND PELVIS WITHOUT CONTRAST  TECHNIQUE: Multidetector CT imaging of the abdomen and pelvis was performed following the standard protocol without intravenous contrast.  COMPARISON:  CT 07/08/2010  FINDINGS: Nonobstructing  small renal calculi on the left. 2 mm stones are present left upper, mid, and lower pole. No ureteral calculi. No right renal calculi. 2 cm right renal cyst.  1 cm hypodensity right lobe of the liver posteriorly is indeterminate but probably benign. This is not seen on the prior study however there was significant streak artifact through the liver. Pancreas and spleen are normal.  Negative for bowel obstruction. Appendix is normal. No free fluid, mass, or adenopathy.  2 cm pleural based density right lateral lung base, not definitely confirmed on prior studies. This has soft tissue density. CT chest is recommended for further evaluation.  IMPRESSION: 3 nonobstructive stones left kidney.  No ureteral calculi.  2 cm pleural based density right lateral lung base. Recommend chest CT for further evaluation.   Electronically Signed   By: Marlan Palau M.D.   On: 08/21/2013 23:23   Dg Chest 2 View  08/22/2013   CLINICAL DATA:  Short of breath, hypoxemia  EXAM: CHEST  2 VIEW  COMPARISON:  09/12/2010. CT abdomen today 8  FINDINGS: Abnormal density on the right. There is a probable right upper lobe mass measuring approximately 3 cm with right peritracheal adenopathy. There is right pleural thickening. These findings were not present in 2011. No layering effusion.  Cardiac enlargement without heart failure.  Left lung clear.  IMPRESSION: Right lung density with right peritracheal adenopathy. Findings are worrisome for carcinoma of the lung. CT chest with contrast recommended for further  evaluation.   Electronically Signed   By: Marlan Palau M.D.   On: 08/22/2013 00:35   Ct Chest W Contrast  08/22/2013   CLINICAL DATA:  Shortness of breath. Chest mass on x-ray.  EXAM: CT CHEST WITH CONTRAST  TECHNIQUE: Multidetector CT imaging of the chest was performed during intravenous contrast administration.  CONTRAST:  OMNIPAQUE IOHEXOL 300 MG/ML  SOLN  COMPARISON:  Chest radiograph 08/22/2013  FINDINGS: Multiple  bilateral pulmonary masses. The largest mass is in the right upper lung anteriorly, measuring 5.2 x 5.4 cm. Multiple additional right lung masses are present, measuring up to 4.7 x 2.9 cm diameter. A single mass is demonstrated in the left upper lung posteriorly, measuring 1.5 cm in diameter. The findings are consistent with primary and/or metastatic disease.  Right hilar lymphadenopathy with lymph nodes measuring up to about 3.8 x 2.5 cm. Subcarinal lymphadenopathy and right pretracheal lymphadenopathy is also present.  Normal heart size. Normal caliber thoracic aorta. Coronary artery calcification. The esophagus is decompressed. No pleural effusions. No pneumothorax.  Visualized portions of the upper abdominal organs demonstrate mild fatty infiltration of the liver.  Degenerative changes in the thoracic spine. No destructive bone lesions are appreciated.  IMPRESSION: Right upper lung mass measuring up to 5.4 cm diameter with multiple additional right lung mass is and a single left lung nodule. Lymphadenopathy in the right hilum and mediastinum. Changes are consistent with primary and/ or metastatic disease.   Electronically Signed   By: Burman Nieves M.D.   On: 08/22/2013 02:19    Chart has been reviewed  Assessment/Plan  47 year old gentleman with history of morbid obesity diabetes hypertension presents with back pain but was found to have incidental finding of lung mass with possible metastases as well as suspicious lesion on his lower back  Present on Admission:  . Mass of lung - pulmonology consult in a.m. for possible bronchoscopy if possible. Will make n.p.o. for now  . Hypertension - continue home and he  . Diabetes mellitus sliding scale would metformin  . Hypoxia we'll continue with oxygen may need to go home on oxygen  . CHF, chronic no evidence of significant fluid overload we'll continue Lasix for now Skin lesion on the back - this needs to further evaluated by dermatology can be done  on outpatient basis but in near future  Prophylaxis:  Lovenox  CODE STATUS: Full code  Other plan as per orders.  I have spent a total of 65 min on this admission time taken to discuss care patient. Demonstrated to patient findings of CT scan. Spoke with pulmonology.   Aleksa Catterton 08/22/2013, 4:22 AM

## 2013-08-22 NOTE — Care Management Note (Unsigned)
    Page 1 of 1   08/24/2013     4:27:09 PM   CARE MANAGEMENT NOTE 08/24/2013  Patient:  Jeffrey Benitez, Jeffrey Benitez   Account Number:  [id number]  Date Initiated:  08/22/2013  Documentation initiated by:  Letha Cape  Subjective/Objective Assessment:   dx lung mass  admit- lives alone. pta indep.     Action/Plan:   10/21- bx- unsuccessful   Anticipated DC Date:  08/25/2013   Anticipated DC Plan:  HOME/SELF CARE      DC Planning Services  CM consult      Choice offered to / List presented to:             Status of service:  In process, will continue to follow Medicare Important Message given?   (If response is "NO", the following Medicare IM given date fields will be blank) Date Medicare IM given:   Date Additional Medicare IM given:    Discharge Disposition:    Per UR Regulation:  Reviewed for med. necessity/level of care/duration of stay  If discussed at Long Length of Stay Meetings, dates discussed:    Comments:  PCP Sigmund Hazel  08/24/13 16:24 Letha Cape RN, BSN [phone] patient lives alone, pta indep.  Patient gets his meds from [facility] and he states he can afford , if not he goes to [facility] because they are all on the $4 list, but [facility] is just convenient for him.  Patient stated he has filed for disability.  I contacted the financial counselor and she stated she would be up to speak with patient today. Patient has transportation at dc.

## 2013-08-22 NOTE — Progress Notes (Signed)
Pt arrived from ED via stretcher. Pt  Oriented to room, safety video has been watched, and pt in bed. Pt in no apparent distress at this time. Pt VSS other than elevated bp. Paged Md on call. Pt to get hydralazine IV for elevated bp. RN will continue to monitor pt.

## 2013-08-22 NOTE — Progress Notes (Signed)
Addendum  Patient seen and examined, chart and data base reviewed.  I agree with the above assessment and plan.  For full details please see Mrs. Algis Downs PA note.  Seen by pulmonology for lung mass, for CT-guided biopsy in AM.   Clint Lipps, MD [facility] Pager: [phone] 08/22/2013, 1:55 PM

## 2013-08-22 NOTE — Consult Note (Signed)
PULMONARY  / CRITICAL CARE MEDICINE  Name: Jeffrey Benitez MRN: [id number] DOB: 07/08/1966    ADMISSION DATE:  08/21/2013 CONSULTATION DATE:  08/22/2013  REFERRING MD : triad PRIMARY SERVICE: triad  CHIEF COMPLAINT:  Back pain  BRIEF PATIENT DESCRIPTION:  47 yo male former smoker presented with abd/flank pain from nephrolithiasis.  Found to have lung mass on CT abd >> confirmed on CT chest.  PCCM consulted to further assess.  SIGNIFICANT EVENTS: 10/20 Admit  10/21 PCCM consult  STUDIES:  10/21 CT chest >> 5.2 cm RUL mass, 4.7 cm Rt peripheral mass, 1.5 cm LUL nodule, Rt hilar/subcarinal/paratracheal LAN  HISTORY OF PRESENT ILLNESS:   47 yo WM former smoker with a pmh of kidney stones,Htn,DM,CHF, diverticulosis with rupture, lap/colostomy/ takedown reanastomosis(2012),and MO. He presented 10-20 with back pain(assumed kidney stones) and CT revealed stones plus large lung masses. CT chest with multiple lung masses bilateral lungs. He is a former smoker (2ppd x 12 years ,quit age 31) and is disabled due to MO and SOB. PCCM asked to evaluate for fob tbbx for pathology. He notes increasing sob over 3 years along with weight gain. No sputum production. No toxic    PAST MEDICAL HISTORY :  Past Medical History  Diagnosis Date  . Kidney stones   . Hypertension   . Diabetes mellitus without complication   . CHF (congestive heart failure)   . Perforated sigmoid colon   . Diverticulitis    Past Surgical History  Procedure Laterality Date  . Kidney stone surgery    . Colon surgery    . Colon ressection     Prior to Admission medications   Medication Sig Start Date End Date Taking? Authorizing Provider  FLUoxetine (PROZAC) 20 MG capsule Take 20 mg by mouth daily.   Yes Historical Provider, MD  furosemide (LASIX) 20 MG tablet Take 20 mg by mouth daily.   Yes Historical Provider, MD  hydrALAZINE (APRESOLINE) 100 MG tablet Take 100 mg by mouth 2 (two) times daily.   Yes Historical  Provider, MD  lisinopril (PRINIVIL,ZESTRIL) 40 MG tablet Take 40 mg by mouth daily.   Yes Historical Provider, MD  LORazepam (ATIVAN) 0.5 MG tablet Take 0.5 mg by mouth 2 (two) times daily as needed for anxiety.   Yes Historical Provider, MD  metFORMIN (GLUCOPHAGE) 500 MG tablet Take 500 mg by mouth 2 (two) times daily with a meal.   Yes Historical Provider, MD  metoprolol (LOPRESSOR) 100 MG tablet Take 100 mg by mouth 2 (two) times daily.   Yes Historical Provider, MD  oxyCODONE-acetaminophen (PERCOCET) 10-325 MG per tablet Take 1 tablet by mouth every 4 (four) hours as needed for pain.   Yes Historical Provider, MD  testosterone cypionate (DEPOTESTOTERONE CYPIONATE) 100 MG/ML injection Inject 100 mg into the muscle every 14 (fourteen) days. For IM use only   Yes Historical Provider, MD   No Known Allergies  FAMILY HISTORY:  Family History  Problem Relation Age of Onset  . Diabetes type II Mother   . Hypertension Father   . Diabetes type II Father   . Heart disease Father    SOCIAL HISTORY:  reports that he has quit smoking. He quit smokeless tobacco use about 14 years ago. He reports that he drinks alcohol. He reports that he does not use illicit drugs.  REVIEW OF SYSTEMS:  .10 point review of system taken, please see HPI for positives and negatives.   SUBJECTIVE:  NAD VITAL SIGNS: Temp:  [97.5 F (  36.4 C)-98.4 F (36.9 C)] 97.6 F (36.4 C) (10/21 0551) Pulse Rate:  [71-89] 89 (10/21 0551) Resp:  [14-26] 22 (10/21 0551) BP: (104-192)/(53-117) 190/104 mmHg (10/21 0551) SpO2:  [92 %-98 %] 98 % (10/21 0551) Weight:  [320 lb (145.151 kg)] 320 lb (145.151 kg) (10/21 0551) Room air  INTAKE / OUTPUT: Intake/Output     10/20 0701 - 10/21 0700 10/21 0701 - 10/22 0700   Total Intake(mL/kg)     Net   0          PHYSICAL EXAMINATION: General:  MO (320 lbs) NAD at rest Neuro:  Intact HEENT:  NO LAN Cardiovascular:  HSR RRR Lungs:  CTA Abdomen:  Obese and old scars  noted Musculoskeletal: Intact Skin:  warm  LABS:  CBC Recent Labs     08/21/13  2220  08/22/13  0622  WBC  8.6  9.0  HGB  15.4  14.7  HCT  45.8  44.4  PLT  244  240   Coag's No results found for this basename: APTT, INR,  in the last 72 hours  BMET Recent Labs     08/21/13  2220  08/22/13  0622  NA  138  137  K  3.7  3.7  CL  96  97  CO2  30  30  BUN  18  17  CREATININE  1.31  1.16  GLUCOSE  122*  112*   Electrolytes Recent Labs     08/21/13  2220  08/22/13  0622  CALCIUM  9.7  9.4  MG   --   1.9  PHOS   --   3.5   ABG Recent Labs     08/22/13  0050  PHART  7.411  PCO2ART  49.1*  PO2ART  58.0*   Liver Enzymes Recent Labs     08/21/13  2220  08/22/13  0622  AST  21  16  ALT  17  11  ALKPHOS  110  111  BILITOT  0.4  0.6  ALBUMIN  3.5  3.5   Cardiac Enzymes Recent Labs     08/21/13  2220  TROPONINI  <0.30  PROBNP  1325.0*   Glucose Recent Labs     08/22/13  0628  08/22/13  0803  GLUCAP  114*  114*    Imaging Ct Abdomen Pelvis Wo Contrast  08/21/2013   CLINICAL DATA:  Nephrolithiasis. Left flank pain  EXAM: CT ABDOMEN AND PELVIS WITHOUT CONTRAST  TECHNIQUE: Multidetector CT imaging of the abdomen and pelvis was performed following the standard protocol without intravenous contrast.  COMPARISON:  CT 07/08/2010  FINDINGS: Nonobstructing small renal calculi on the left. 2 mm stones are present left upper, mid, and lower pole. No ureteral calculi. No right renal calculi. 2 cm right renal cyst.  1 cm hypodensity right lobe of the liver posteriorly is indeterminate but probably benign. This is not seen on the prior study however there was significant streak artifact through the liver. Pancreas and spleen are normal.  Negative for bowel obstruction. Appendix is normal. No free fluid, mass, or adenopathy.  2 cm pleural based density right lateral lung base, not definitely confirmed on prior studies. This has soft tissue density. CT chest is  recommended for further evaluation.  IMPRESSION: 3 nonobstructive stones left kidney.  No ureteral calculi.  2 cm pleural based density right lateral lung base. Recommend chest CT for further evaluation.   Electronically Signed   By: Charles  Clark M.D.     On: 08/21/2013 23:23   Dg Chest 2 View  08/22/2013   CLINICAL DATA:  Short of breath, hypoxemia  EXAM: CHEST  2 VIEW  COMPARISON:  09/12/2010. CT abdomen today 8  FINDINGS: Abnormal density on the right. There is a probable right upper lobe mass measuring approximately 3 cm with right peritracheal adenopathy. There is right pleural thickening. These findings were not present in 2011. No layering effusion.  Cardiac enlargement without heart failure.  Left lung clear.  IMPRESSION: Right lung density with right peritracheal adenopathy. Findings are worrisome for carcinoma of the lung. CT chest with contrast recommended for further evaluation.   Electronically Signed   By: Charles  Clark M.D.   On: 08/22/2013 00:35   Ct Chest W Contrast  08/22/2013   CLINICAL DATA:  Shortness of breath. Chest mass on x-ray.  EXAM: CT CHEST WITH CONTRAST  TECHNIQUE: Multidetector CT imaging of the chest was performed during intravenous contrast administration.  CONTRAST:  100mL OMNIPAQUE IOHEXOL 300 MG/ML  SOLN  COMPARISON:  Chest radiograph 08/22/2013  FINDINGS: Multiple bilateral pulmonary masses. The largest mass is in the right upper lung anteriorly, measuring 5.2 x 5.4 cm. Multiple additional right lung masses are present, measuring up to 4.7 x 2.9 cm diameter. A single mass is demonstrated in the left upper lung posteriorly, measuring 1.5 cm in diameter. The findings are consistent with primary and/or metastatic disease.  Right hilar lymphadenopathy with lymph nodes measuring up to about 3.8 x 2.5 cm. Subcarinal lymphadenopathy and right pretracheal lymphadenopathy is also present.  Normal heart size. Normal caliber thoracic aorta. Coronary artery calcification. The  esophagus is decompressed. No pleural effusions. No pneumothorax.  Visualized portions of the upper abdominal organs demonstrate mild fatty infiltration of the liver.  Degenerative changes in the thoracic spine. No destructive bone lesions are appreciated.  IMPRESSION: Right upper lung mass measuring up to 5.4 cm diameter with multiple additional right lung mass is and a single left lung nodule. Lymphadenopathy in the right hilum and mediastinum. Changes are consistent with primary and/ or metastatic disease.   Electronically Signed   By: William  Stevens M.D.   On: 08/22/2013 02:19    ASSESSMENT / PLAN:  47 yo male former smoker with incidental finding of RUL mass with satellite Rt peripheral mass abutting pleural, mediastinal and hilar adenopathy, and satellite LUL nodule.  Main concern is for malignancy. P: -will ask IR to assess for trans-thoracic needle biopsy of Rt peripheral lung lesion first -if not amenable to biopsy by IR, will then need to arrange for bronchoscopy -okay to resume diet for now  Steve Minor ACNP [facility] Pager [phone] till 3 pm If no answer page [phone] 08/22/2013, 9:06 AM   Reviewed above, examined pt, and documentation changes made as needed.  He has incidental finding of lung mass with adenopathy and satellite lesions.  Concern is for cancer.  He needs tissue sampling.  Options are 1) needle biopsy with IR versus 2) bronchoscopy with +/- EBUS.    Pro/con's of each procedure discussed with pt and his mother.  Safest initial approach likely by trans-thoracic needle biopsy with IR.  Will ask IR to assess >> if biopsy by IR not feasible or if biopsy non-diagnostic, then will need to arrange for bronchoscopy.  Explained to pt that this w/u does not need to be done on inpatient basis, and can be arranged for as outpt once he is medically stable for discharge otherwise.  Will continue to arrange for tissue   sampling while he remains in hospital.  Manpreet Strey,  MD [facility] 08/22/2013, 1:49 PM Pager:  [phone] After 3pm call: [phone]     

## 2013-08-22 NOTE — Consult Note (Signed)
HPI: Jeffrey Benitez is an 47 y.o. male with new finding of right sided lung masses. He was initially seen by PCCM and was to have bronchoscopy, but this has been deferred and IR is asked to eval for CT guided [perc biopsy. Images reviewed, large right lung lesions appear amenable to biopsy. PMHx, meds reviewed. Pt is obese with hx of CHF and OSA. Admits he is supposed to use CPAP at night, but does not. Currently denies SOB.  Past Medical History:  Past Medical History  Diagnosis Date  . Kidney stones   . Hypertension   . Diabetes mellitus without complication   . CHF (congestive heart failure)   . Perforated sigmoid colon   . Diverticulitis     Past Surgical History:  Past Surgical History  Procedure Laterality Date  . Kidney stone surgery    . Colon surgery    . Colon ressection      Family History:  Family History  Problem Relation Age of Onset  . Diabetes type II Mother   . Hypertension Father   . Diabetes type II Father   . Heart disease Father     Social History:  reports that he has quit smoking. He quit smokeless tobacco use about 14 years ago. He reports that he drinks alcohol. He reports that he does not use illicit drugs.  Allergies: No Known Allergies  Medications:   Medication List    ASK your doctor about these medications       FLUoxetine 20 MG capsule  Commonly known as:  PROZAC  Take 20 mg by mouth daily.     furosemide 20 MG tablet  Commonly known as:  LASIX  Take 20 mg by mouth daily.     hydrALAZINE 100 MG tablet  Commonly known as:  APRESOLINE  Take 100 mg by mouth 2 (two) times daily.     lisinopril 40 MG tablet  Commonly known as:  PRINIVIL,ZESTRIL  Take 40 mg by mouth daily.     LORazepam 0.5 MG tablet  Commonly known as:  ATIVAN  Take 0.5 mg by mouth 2 (two) times daily as needed for anxiety.     metFORMIN 500 MG tablet  Commonly known as:  GLUCOPHAGE  Take 500 mg by mouth 2 (two) times daily with a meal.     metoprolol  100 MG tablet  Commonly known as:  LOPRESSOR  Take 100 mg by mouth 2 (two) times daily.     oxyCODONE-acetaminophen 10-325 MG per tablet  Commonly known as:  PERCOCET  Take 1 tablet by mouth every 4 (four) hours as needed for pain.     testosterone cypionate 100 MG/ML injection  Commonly known as:  DEPOTESTOTERONE CYPIONATE  Inject 100 mg into the muscle every 14 (fourteen) days. For IM use only        Please HPI for pertinent positives, otherwise complete 10 system ROS negative.  Physical Exam: BP 174/97  Pulse 79  Temp(Src) 98.3 F (36.8 C) (Oral)  Resp 20  Ht 5' 8.5" (1.74 m)  Wt 320 lb (145.151 kg)  BMI 47.94 kg/m2  SpO2 93% Body mass index is 47.94 kg/(m^2).   General Appearance:  Alert, cooperative, obese WM, appears stated age  Head:  Normocephalic, without obvious abnormality, atraumatic  ENT: Unremarkable  Neck: Supple, symmetrical, trachea midline  Lungs:   Clear to auscultation bilaterally, no w/r/r.  Chest Wall:  No tenderness or deformity  Heart:  Regular rate and rhythm, S1, S2 normal,  no murmur, rub or gallop.  Abdomen:   Soft, non-tender, non distended. Well healed midline incision.  Extremities: Extremities normal, atraumatic, no cyanosis or edema  Pulses: 2+ and symmetric  Neurologic: Normal affect, no gross deficits.   Results for orders placed during the hospital encounter of 08/21/13 (from the past 48 hour(s))  URINALYSIS, ROUTINE W REFLEX MICROSCOPIC     Status: Abnormal   Collection Time    08/21/13  9:36 PM      Result Value Range   Color, Urine YELLOW  YELLOW   APPearance CLOUDY (*) CLEAR   Specific Gravity, Urine 1.028  1.005 - 1.030   pH 6.0  5.0 - 8.0   Glucose, UA NEGATIVE  NEGATIVE mg/dL   Hgb urine dipstick NEGATIVE  NEGATIVE   Bilirubin Urine NEGATIVE  NEGATIVE   Ketones, ur NEGATIVE  NEGATIVE mg/dL   Protein, ur 30 (*) NEGATIVE mg/dL   Urobilinogen, UA 0.2  0.0 - 1.0 mg/dL   Nitrite NEGATIVE  NEGATIVE   Leukocytes, UA NEGATIVE   NEGATIVE  URINE MICROSCOPIC-ADD ON     Status: Abnormal   Collection Time    08/21/13  9:36 PM      Result Value Range   Squamous Epithelial / LPF FEW (*) RARE   WBC, UA 0-2  <3 WBC/hpf   RBC / HPF 0-2  <3 RBC/hpf   Bacteria, UA RARE  RARE   Casts HYALINE CASTS (*) NEGATIVE  CBC WITH DIFFERENTIAL     Status: None   Collection Time    08/21/13 10:20 PM      Result Value Range   WBC 8.6  4.0 - 10.5 K/uL   RBC 5.54  4.22 - 5.81 MIL/uL   Hemoglobin 15.4  13.0 - 17.0 g/dL   HCT 14.7  82.9 - 56.2 %   MCV 82.7  78.0 - 100.0 fL   MCH 27.8  26.0 - 34.0 pg   MCHC 33.6  30.0 - 36.0 g/dL   RDW 13.0  86.5 - 78.4 %   Platelets 244  150 - 400 K/uL   Neutrophils Relative % 70  43 - 77 %   Neutro Abs 6.0  1.7 - 7.7 K/uL   Lymphocytes Relative 21  12 - 46 %   Lymphs Abs 1.8  0.7 - 4.0 K/uL   Monocytes Relative 6  3 - 12 %   Monocytes Absolute 0.6  0.1 - 1.0 K/uL   Eosinophils Relative 2  0 - 5 %   Eosinophils Absolute 0.2  0.0 - 0.7 K/uL   Basophils Relative 0  0 - 1 %   Basophils Absolute 0.0  0.0 - 0.1 K/uL  COMPREHENSIVE METABOLIC PANEL     Status: Abnormal   Collection Time    08/21/13 10:20 PM      Result Value Range   Sodium 138  135 - 145 mEq/L   Potassium 3.7  3.5 - 5.1 mEq/L   Chloride 96  96 - 112 mEq/L   CO2 30  19 - 32 mEq/L   Glucose, Bld 122 (*) 70 - 99 mg/dL   BUN 18  6 - 23 mg/dL   Creatinine, Ser 6.96  0.50 - 1.35 mg/dL   Calcium 9.7  8.4 - 29.5 mg/dL   Total Protein 7.5  6.0 - 8.3 g/dL   Albumin 3.5  3.5 - 5.2 g/dL   AST 21  0 - 37 U/L   ALT 17  0 - 53 U/L  Alkaline Phosphatase 110  39 - 117 U/L   Total Bilirubin 0.4  0.3 - 1.2 mg/dL   GFR calc non Af Amer 63 (*) >90 mL/min   GFR calc Af Amer 73 (*) >90 mL/min   Comment: (NOTE)     The eGFR has been calculated using the CKD EPI equation.     This calculation has not been validated in all clinical situations.     eGFR's persistently <90 mL/min signify possible Chronic Kidney     Disease.  LIPASE, BLOOD      Status: None   Collection Time    08/21/13 10:20 PM      Result Value Range   Lipase 46  11 - 59 U/L  PRO B NATRIURETIC PEPTIDE     Status: Abnormal   Collection Time    08/21/13 10:20 PM      Result Value Range   Pro B Natriuretic peptide (BNP) 1325.0 (*) 0 - 125 pg/mL  TROPONIN I     Status: None   Collection Time    08/21/13 10:20 PM      Result Value Range   Troponin I <0.30  <0.30 ng/mL   Comment:            Due to the release kinetics of cTnI,     a negative result within the first hours     of the onset of symptoms does not rule out     myocardial infarction with certainty.     If myocardial infarction is still suspected,     repeat the test at appropriate intervals.  POCT I-STAT 3, BLOOD GAS (G3+)     Status: Abnormal   Collection Time    08/22/13 12:50 AM      Result Value Range   pH, Arterial 7.411  7.350 - 7.450   pCO2 arterial 49.1 (*) 35.0 - 45.0 mmHg   pO2, Arterial 58.0 (*) 80.0 - 100.0 mmHg   Bicarbonate 31.2 (*) 20.0 - 24.0 mEq/L   TCO2 33  0 - 100 mmol/L   O2 Saturation 89.0     Acid-Base Excess 5.0 (*) 0.0 - 2.0 mmol/L   Collection site RADIAL, ALLEN'S TEST ACCEPTABLE     Sample type ARTERIAL    HEMOGLOBIN A1C     Status: Abnormal   Collection Time    08/22/13  6:22 AM      Result Value Range   Hemoglobin A1C 6.0 (*) <5.7 %   Comment: (NOTE)                                                                               According to the ADA Clinical Practice Recommendations for 2011, when     HbA1c is used as a screening test:      >=6.5%   Diagnostic of Diabetes Mellitus               (if abnormal result is confirmed)     5.7-6.4%   Increased risk of developing Diabetes Mellitus     References:Diagnosis and Classification of Diabetes Mellitus,Diabetes     Care,2011,34(Suppl 1):S62-S69 and Standards of Medical Care in  Diabetes - 2011,Diabetes Care,2011,34 (Suppl 1):S11-S61.   Mean Plasma Glucose 126 (*) <117 mg/dL   Comment: Performed at  [facility]  MAGNESIUM     Status: None   Collection Time    08/22/13  6:22 AM      Result Value Range   Magnesium 1.9  1.5 - 2.5 mg/dL  PHOSPHORUS     Status: None   Collection Time    08/22/13  6:22 AM      Result Value Range   Phosphorus 3.5  2.3 - 4.6 mg/dL  COMPREHENSIVE METABOLIC PANEL     Status: Abnormal   Collection Time    08/22/13  6:22 AM      Result Value Range   Sodium 137  135 - 145 mEq/L   Potassium 3.7  3.5 - 5.1 mEq/L   Chloride 97  96 - 112 mEq/L   CO2 30  19 - 32 mEq/L   Glucose, Bld 112 (*) 70 - 99 mg/dL   BUN 17  6 - 23 mg/dL   Creatinine, Ser 1.61  0.50 - 1.35 mg/dL   Calcium 9.4  8.4 - 09.6 mg/dL   Total Protein 7.2  6.0 - 8.3 g/dL   Albumin 3.5  3.5 - 5.2 g/dL   AST 16  0 - 37 U/L   ALT 11  0 - 53 U/L   Alkaline Phosphatase 111  39 - 117 U/L   Total Bilirubin 0.6  0.3 - 1.2 mg/dL   GFR calc non Af Amer 73 (*) >90 mL/min   GFR calc Af Amer 84 (*) >90 mL/min   Comment: (NOTE)     The eGFR has been calculated using the CKD EPI equation.     This calculation has not been validated in all clinical situations.     eGFR's persistently <90 mL/min signify possible Chronic Kidney     Disease.  CBC     Status: None   Collection Time    08/22/13  6:22 AM      Result Value Range   WBC 9.0  4.0 - 10.5 K/uL   RBC 5.30  4.22 - 5.81 MIL/uL   Hemoglobin 14.7  13.0 - 17.0 g/dL   HCT 04.5  40.9 - 81.1 %   MCV 83.8  78.0 - 100.0 fL   MCH 27.7  26.0 - 34.0 pg   MCHC 33.1  30.0 - 36.0 g/dL   RDW 91.4  78.2 - 95.6 %   Platelets 240  150 - 400 K/uL  GLUCOSE, CAPILLARY     Status: Abnormal   Collection Time    08/22/13  6:28 AM      Result Value Range   Glucose-Capillary 114 (*) 70 - 99 mg/dL  GLUCOSE, CAPILLARY     Status: Abnormal   Collection Time    08/22/13  8:03 AM      Result Value Range   Glucose-Capillary 114 (*) 70 - 99 mg/dL  GLUCOSE, CAPILLARY     Status: Abnormal   Collection Time    08/22/13 11:31 AM      Result Value Range    Glucose-Capillary 114 (*) 70 - 99 mg/dL   Ct Abdomen Pelvis Wo Contrast  08/21/2013   CLINICAL DATA:  Nephrolithiasis. Left flank pain  EXAM: CT ABDOMEN AND PELVIS WITHOUT CONTRAST  TECHNIQUE: Multidetector CT imaging of the abdomen and pelvis was performed following the standard protocol without intravenous contrast.  COMPARISON:  CT 07/08/2010  FINDINGS: Nonobstructing small renal  calculi on the left. 2 mm stones are present left upper, mid, and lower pole. No ureteral calculi. No right renal calculi. 2 cm right renal cyst.  1 cm hypodensity right lobe of the liver posteriorly is indeterminate but probably benign. This is not seen on the prior study however there was significant streak artifact through the liver. Pancreas and spleen are normal.  Negative for bowel obstruction. Appendix is normal. No free fluid, mass, or adenopathy.  2 cm pleural based density right lateral lung base, not definitely confirmed on prior studies. This has soft tissue density. CT chest is recommended for further evaluation.  IMPRESSION: 3 nonobstructive stones left kidney.  No ureteral calculi.  2 cm pleural based density right lateral lung base. Recommend chest CT for further evaluation.   Electronically Signed   By: Marlan Palau M.D.   On: 08/21/2013 23:23   Dg Chest 2 View  08/22/2013   CLINICAL DATA:  Short of breath, hypoxemia  EXAM: CHEST  2 VIEW  COMPARISON:  09/12/2010. CT abdomen today 8  FINDINGS: Abnormal density on the right. There is a probable right upper lobe mass measuring approximately 3 cm with right peritracheal adenopathy. There is right pleural thickening. These findings were not present in 2011. No layering effusion.  Cardiac enlargement without heart failure.  Left lung clear.  IMPRESSION: Right lung density with right peritracheal adenopathy. Findings are worrisome for carcinoma of the lung. CT chest with contrast recommended for further evaluation.   Electronically Signed   By: Marlan Palau M.D.    On: 08/22/2013 00:35   Ct Chest W Contrast  08/22/2013   CLINICAL DATA:  Shortness of breath. Chest mass on x-ray.  EXAM: CT CHEST WITH CONTRAST  TECHNIQUE: Multidetector CT imaging of the chest was performed during intravenous contrast administration.  CONTRAST:  OMNIPAQUE IOHEXOL 300 MG/ML  SOLN  COMPARISON:  Chest radiograph 08/22/2013  FINDINGS: Multiple bilateral pulmonary masses. The largest mass is in the right upper lung anteriorly, measuring 5.2 x 5.4 cm. Multiple additional right lung masses are present, measuring up to 4.7 x 2.9 cm diameter. A single mass is demonstrated in the left upper lung posteriorly, measuring 1.5 cm in diameter. The findings are consistent with primary and/or metastatic disease.  Right hilar lymphadenopathy with lymph nodes measuring up to about 3.8 x 2.5 cm. Subcarinal lymphadenopathy and right pretracheal lymphadenopathy is also present.  Normal heart size. Normal caliber thoracic aorta. Coronary artery calcification. The esophagus is decompressed. No pleural effusions. No pneumothorax.  Visualized portions of the upper abdominal organs demonstrate mild fatty infiltration of the liver.  Degenerative changes in the thoracic spine. No destructive bone lesions are appreciated.  IMPRESSION: Right upper lung mass measuring up to 5.4 cm diameter with multiple additional right lung mass is and a single left lung nodule. Lymphadenopathy in the right hilum and mediastinum. Changes are consistent with primary and/ or metastatic disease.   Electronically Signed   By: Burman Nieves M.D.   On: 08/22/2013 02:19    Assessment/Plan Right lung masses suspicious for primary lung cancer Images reviewed with Dr. Bonnielee Haff. Amenable to CT guided perc biopsy of anterior lung lesion. Explained procedure, including risks of infection, bleeding/hemorrhage, PTX requiring chest drain. Explained use of sedation including risks of resp distress. Labs reviewed, will check coags. NPO p  MN Consent signed in chart for procedure tomorrow.  Brayton El PA-C 08/22/2013, 2:18 PM

## 2013-08-22 NOTE — Progress Notes (Signed)
INITIAL NUTRITION ASSESSMENT  DOCUMENTATION CODES Per approved criteria  -Morbid Obesity   INTERVENTION:  Prostat liquid protein 30 ml twice daily (100 kcals, 15 gm protein per dose) RD to follow for nutrition care plan  NUTRITION DIAGNOSIS: Increased nutrient needs related to suspected catabolic illness as evidenced by estimated protein needs  Goal: Pt to meet >/= 90% of their estimated nutrition needs   Monitor:  PO & supplemental intake, weight, labs, I/O's  Reason for Assessment: Consult  47 y.o. male  Admitting Dx: Mass of lung  ASSESSMENT: Patient with PMH of HTN, DM, CHF, perforated sigmoid colon and diverticulitis presented to ED with back pain for the past 3 weeks; CT of chest showed lesion worrisome for primary carcinoma and metastatic disease.   RD unable to wake patient for interview; per H&P patient reported a decreased appetite with weight loss; PO intake 100% per flowsheet records; noted plan for CT guided perc biopsy of anterior lung lesion tomorrow (08/23/13); would benefit from additional protein -- RD to order supplement.  Height: Ht Readings from Last 1 Encounters:  08/22/13 5' 8.5" (1.74 m)    Weight: Wt Readings from Last 1 Encounters:  08/22/13 320 lb (145.151 kg)    Ideal Body Weight: 154 lb  % Ideal Body Weight: 207%  Wt Readings from Last 10 Encounters:  08/22/13 320 lb (145.151 kg)    Usual Body Weight: ---  % Usual Body Weight: ---  BMI:  Body mass index is 47.94 kg/(m^2).  Estimated Nutritional Needs: Kcal: 2200-2400 Protein: 120-130 gm Fluid: 2.2-2.4 L  Skin: Intact  Diet Order: Carb Control  EDUCATION NEEDS: -Education not appropriate    Intake/Output Summary (Last 24 hours) at 08/22/13 1513 Last data filed at 08/22/13 1300  Gross per 24 hour  Intake    240 ml  Output      0 ml  Net    240 ml    Labs:   Recent Labs Lab 08/21/13 2220 08/22/13 0622  NA 138 137  K 3.7 3.7  CL 96 97  CO2 30 30  BUN 18 17   CREATININE 1.31 1.16  CALCIUM 9.7 9.4  MG  --  1.9  PHOS  --  3.5  GLUCOSE 122* 112*    CBG (last 3)   Recent Labs  08/22/13 0628 08/22/13 0803 08/22/13 1131  GLUCAP 114* 114* 114*    Scheduled Meds: . docusate sodium  100 mg Oral BID  . [START ON 08/24/2013] enoxaparin (LOVENOX) injection  70 mg Subcutaneous Q24H  . FLUoxetine  20 mg Oral Daily  . furosemide  20 mg Oral Daily  . hydrALAZINE  100 mg Oral BID  . [START ON 08/23/2013] influenza vac split quadrivalent PF  0.5 mL Intramuscular Tomorrow-1000  . insulin aspart  0-9 Units Subcutaneous Q4H  . lisinopril  40 mg Oral Daily  . metoprolol  100 mg Oral BID  . [START ON 08/23/2013] pneumococcal 23 valent vaccine  0.5 mL Intramuscular Tomorrow-1000    Continuous Infusions:   Past Medical History  Diagnosis Date  . Kidney stones   . Hypertension   . Diabetes mellitus without complication   . CHF (congestive heart failure)   . Perforated sigmoid colon   . Diverticulitis     Past Surgical History  Procedure Laterality Date  . Kidney stone surgery    . Colon surgery    . Colon ressection      Maureen Chatters, RD, LDN Pager #: [phone] After-Hours Pager #: [phone]

## 2013-08-23 ENCOUNTER — Inpatient Hospital Stay (HOSPITAL_COMMUNITY): Payer: Medicaid Other

## 2013-08-23 ENCOUNTER — Encounter (HOSPITAL_COMMUNITY)
Admission: EM | Disposition: A | Discharge status: Home / Self Care | Payer: Self-pay | Source: Home / Self Care | Attending: Internal Medicine

## 2013-08-23 DIAGNOSIS — E119 Type 2 diabetes mellitus without complications: Secondary | ICD-10-CM

## 2013-08-23 DIAGNOSIS — I10 Essential (primary) hypertension: Secondary | ICD-10-CM

## 2013-08-23 DIAGNOSIS — J9600 Acute respiratory failure, unspecified whether with hypoxia or hypercapnia: Secondary | ICD-10-CM

## 2013-08-23 DIAGNOSIS — I509 Heart failure, unspecified: Secondary | ICD-10-CM

## 2013-08-23 DIAGNOSIS — I517 Cardiomegaly: Secondary | ICD-10-CM

## 2013-08-23 HISTORY — PX: VIDEO BRONCHOSCOPY: SHX5072

## 2013-08-23 LAB — PROTIME-INR
INR: 1.13 (ref 0.00–1.49)
Prothrombin Time: 14.3 seconds (ref 11.6–15.2)

## 2013-08-23 LAB — GLUCOSE, CAPILLARY
Glucose-Capillary: 128 mg/dL — ABNORMAL HIGH (ref 70–99)
Glucose-Capillary: 135 mg/dL — ABNORMAL HIGH (ref 70–99)
Glucose-Capillary: 163 mg/dL — ABNORMAL HIGH (ref 70–99)
Glucose-Capillary: 198 mg/dL — ABNORMAL HIGH (ref 70–99)

## 2013-08-23 LAB — APTT: aPTT: 39 seconds — ABNORMAL HIGH (ref 24–37)

## 2013-08-23 SURGERY — BRONCHOSCOPY, WITH FLUOROSCOPY
Anesthesia: Moderate Sedation | Laterality: Bilateral

## 2013-08-23 MED ORDER — LISINOPRIL 40 MG PO TABS
40.0000 mg | ORAL_TABLET | Freq: Every day | ORAL | Status: DC
  Administered 2013-08-23 – 2013-08-26 (×3): 40 mg via ORAL
  Filled 2013-08-23 (×4): qty 1

## 2013-08-23 MED ORDER — ONDANSETRON HCL 4 MG PO TABS
4.0000 mg | ORAL_TABLET | Freq: Four times a day (QID) | ORAL | Status: DC | PRN
Start: 2013-08-23 — End: 2013-08-26

## 2013-08-23 MED ORDER — MIDAZOLAM HCL 10 MG/2ML IJ SOLN
INTRAMUSCULAR | Status: DC | PRN
  Administered 2013-08-23: 12:00:00 3 mg via INTRAVENOUS
  Administered 2013-08-23 (×2): 1 mg via INTRAVENOUS

## 2013-08-23 MED ORDER — OXYCODONE HCL 5 MG PO TABS
5.0000 mg | ORAL_TABLET | ORAL | Status: DC | PRN
Start: 2013-08-23 — End: 2013-08-24
  Administered 2013-08-23 – 2013-08-24 (×4): 10 mg via ORAL
  Filled 2013-08-23 (×4): qty 2

## 2013-08-23 MED ORDER — FUROSEMIDE 20 MG PO TABS
20.0000 mg | ORAL_TABLET | Freq: Every day | ORAL | Status: DC
  Administered 2013-08-23 – 2013-08-24 (×2): 20 mg via ORAL
  Filled 2013-08-23 (×2): qty 1

## 2013-08-23 MED ORDER — ENOXAPARIN SODIUM 80 MG/0.8ML ~~LOC~~ SOLN
70.0000 mg | SUBCUTANEOUS | Status: DC
  Administered 2013-08-24: 70 mg via SUBCUTANEOUS
  Filled 2013-08-23 (×2): qty 0.8

## 2013-08-23 MED ORDER — HYDROMORPHONE HCL PF 1 MG/ML IJ SOLN
1.0000 mg | INTRAMUSCULAR | Status: DC | PRN
  Administered 2013-08-23: 1 mg via INTRAVENOUS
  Filled 2013-08-23 (×2): qty 1

## 2013-08-23 MED ORDER — HYDRALAZINE HCL 50 MG PO TABS
100.0000 mg | ORAL_TABLET | Freq: Three times a day (TID) | ORAL | Status: DC
  Administered 2013-08-23 – 2013-08-26 (×8): 100 mg via ORAL
  Filled 2013-08-23 (×11): qty 2

## 2013-08-23 MED ORDER — INSULIN ASPART 100 UNIT/ML ~~LOC~~ SOLN
0.0000 [IU] | Freq: Three times a day (TID) | SUBCUTANEOUS | Status: DC
  Administered 2013-08-24 – 2013-08-26 (×7): 1 [IU] via SUBCUTANEOUS

## 2013-08-23 MED ORDER — ONDANSETRON HCL 4 MG/2ML IJ SOLN: 4.0000 mg | Freq: Four times a day (QID) | INTRAMUSCULAR | Status: DC

## 2013-08-23 MED ORDER — ALBUTEROL SULFATE (5 MG/ML) 0.5% IN NEBU: 2.5000 mg | INHALATION_SOLUTION | RESPIRATORY_TRACT | Status: DC | PRN

## 2013-08-23 MED ORDER — MAGNESIUM CITRATE PO SOLN
1.0000 | Freq: Once | ORAL | Status: AC
  Administered 2013-08-23: 1 via ORAL
  Filled 2013-08-23: qty 296

## 2013-08-23 MED ORDER — DOCUSATE SODIUM 100 MG PO CAPS
100.0000 mg | ORAL_CAPSULE | Freq: Two times a day (BID) | ORAL | Status: DC
  Administered 2013-08-23 – 2013-08-26 (×6): 100 mg via ORAL
  Filled 2013-08-23 (×6): qty 1

## 2013-08-23 MED ORDER — MIDAZOLAM HCL 5 MG/ML IJ SOLN
INTRAMUSCULAR | Status: AC
  Filled 2013-08-23: qty 2

## 2013-08-23 MED ORDER — INFLUENZA VAC SPLIT QUAD 0.5 ML IM SUSP
0.5000 mL | INTRAMUSCULAR | Status: AC
  Administered 2013-08-24: 0.5 mL via INTRAMUSCULAR
  Filled 2013-08-23: qty 0.5

## 2013-08-23 MED ORDER — ACETAMINOPHEN 325 MG PO TABS
650.0000 mg | ORAL_TABLET | Freq: Four times a day (QID) | ORAL | Status: DC | PRN
  Administered 2013-08-23 – 2013-08-25 (×5): 650 mg via ORAL
  Filled 2013-08-23 (×5): qty 2

## 2013-08-23 MED ORDER — MAGNESIUM CITRATE PO SOLN
1.0000 | Freq: Once | ORAL | Status: DC
  Filled 2013-08-23: qty 296

## 2013-08-23 MED ORDER — FENTANYL CITRATE 0.05 MG/ML IJ SOLN
INTRAMUSCULAR | Status: DC | PRN
  Administered 2013-08-23: 50 ug via INTRAVENOUS
  Administered 2013-08-23 (×2): 25 ug via INTRAVENOUS

## 2013-08-23 MED ORDER — LIDOCAINE HCL 2 % EX GEL
CUTANEOUS | Status: DC | PRN
  Administered 2013-08-23: 1

## 2013-08-23 MED ORDER — PRO-STAT SUGAR FREE PO LIQD
30.0000 mL | Freq: Three times a day (TID) | ORAL | Status: DC
  Administered 2013-08-24 (×2): 30 mL via ORAL
  Filled 2013-08-23 (×9): qty 30

## 2013-08-23 MED ORDER — PNEUMOCOCCAL VAC POLYVALENT 25 MCG/0.5ML IJ INJ
0.5000 mL | INJECTION | INTRAMUSCULAR | Status: AC
  Administered 2013-08-24: 10:00:00 0.5 mL via INTRAMUSCULAR
  Filled 2013-08-23: qty 0.5

## 2013-08-23 MED ORDER — LIDOCAINE HCL (PF) 1 % IJ SOLN
INTRAMUSCULAR | Status: DC | PRN
  Administered 2013-08-23: 6 mL

## 2013-08-23 MED ORDER — PHENYLEPHRINE HCL 0.25 % NA SOLN
NASAL | Status: DC | PRN
  Administered 2013-08-23: 11:00:00 2 via NASAL

## 2013-08-23 MED ORDER — METOPROLOL TARTRATE 100 MG PO TABS
100.0000 mg | ORAL_TABLET | Freq: Two times a day (BID) | ORAL | Status: DC
  Administered 2013-08-23 – 2013-08-26 (×5): 100 mg via ORAL
  Filled 2013-08-23 (×8): qty 1

## 2013-08-23 MED ORDER — FLUOXETINE HCL 20 MG PO CAPS
20.0000 mg | ORAL_CAPSULE | Freq: Every day | ORAL | Status: DC
  Administered 2013-08-23 – 2013-08-26 (×3): 20 mg via ORAL
  Filled 2013-08-23 (×4): qty 1

## 2013-08-23 MED ORDER — FENTANYL CITRATE 0.05 MG/ML IJ SOLN
INTRAMUSCULAR | Status: AC
  Filled 2013-08-23: qty 4

## 2013-08-23 MED ORDER — HYDRALAZINE HCL 20 MG/ML IJ SOLN
10.0000 mg | INTRAMUSCULAR | Status: DC | PRN
  Administered 2013-08-23: 11:00:00 10 mg via INTRAVENOUS
  Filled 2013-08-23: qty 1

## 2013-08-23 MED ORDER — HYDRALAZINE HCL 20 MG/ML IJ SOLN
10.0000 mg | INTRAMUSCULAR | Status: DC | PRN
  Administered 2013-08-24 – 2013-08-26 (×4): 10 mg via INTRAVENOUS
  Filled 2013-08-23 (×4): qty 1

## 2013-08-23 MED ORDER — HYDROMORPHONE HCL PF 1 MG/ML IJ SOLN
0.5000 mg | INTRAMUSCULAR | Status: DC | PRN
Start: 2013-08-23 — End: 2013-08-23
  Administered 2013-08-23: 1 mg via INTRAVENOUS
  Filled 2013-08-23: qty 1

## 2013-08-23 MED ORDER — LIDOCAINE HCL 1 % IJ SOLN: INTRAMUSCULAR | Status: DC | PRN

## 2013-08-23 MED ORDER — ONDANSETRON HCL 4 MG/2ML IJ SOLN
4.0000 mg | Freq: Four times a day (QID) | INTRAMUSCULAR | Status: DC | PRN
  Administered 2013-08-23: 4 mg via INTRAVENOUS
  Filled 2013-08-23: qty 2

## 2013-08-23 MED ORDER — HYDRALAZINE HCL 50 MG PO TABS
100.0000 mg | ORAL_TABLET | Freq: Three times a day (TID) | ORAL | Status: DC
  Filled 2013-08-23 (×3): qty 2

## 2013-08-23 MED ORDER — HYDRALAZINE HCL 20 MG/ML IJ SOLN
10.0000 mg | Freq: Once | INTRAMUSCULAR | Status: AC
  Administered 2013-08-23: 20 mg via INTRAVENOUS

## 2013-08-23 NOTE — Interval H&P Note (Signed)
Interval PCCM Note  Case Reviewed with Drs Craige Cotta and Reunion. Plan is for FOB with biopsies today. Pt understands the procedure, benefits, risks. He has been hypertensive, I will treat with hydralazine and then initiate sedation.   Levy Pupa, MD, PhD 08/23/2013, 12:08 PM [facility] [phone] or if no answer [phone]

## 2013-08-23 NOTE — Op Note (Signed)
Video Laryngoscopy and Attempted Bronchoscopy Procedure Note  Date of Operation: 08/23/2013  Pre-op Diagnosis: R lung masses  Post-op Diagnosis: Same  Surgeon: Levy Pupa  Assistants: none  Anesthesia: conscious sedation, moderate sedation  Meds Given: fentanyl , versed 5mg  in divided doses, 1% lidocaine 15cc total  Operation: Flexible video fiberoptic bronchoscopy and biopsies.  Estimated Blood Loss: 0cc  Complications: respiratory suppression despite inadequate sedation  Indications and History: Jeffrey Benitez is 47 y.o. with Bilateral lung masses on CT scan of the chest 10/21.  Recommendation was to perform video fiberoptic bronchoscopy with biopsies. The risks, benefits, complications, treatment options and expected outcomes were discussed with the patient.  The possibilities of pneumothorax, pneumonia, reaction to medication, pulmonary aspiration, perforation of a viscus, bleeding, failure to diagnose a condition and creating a complication requiring transfusion or operation were discussed with the patient who freely signed the consent.     Description of Procedure: The patient was seen in the Preoperative Area, was examined and was deemed appropriate to proceed.  The patient was taken to Endoscopy at [facility], identified as [name] and the procedure verified as Flexible Video Fiberoptic Bronchoscopy.  A Time Out was held and the above information confirmed.   Conscious sedation was initiated as indicated above. With the meds given the patient would still wake with verbal stimulation but had desaturation. He also had discomfort with insertion of the bronchoscope. The scope was able to be introduced via the R nare with full inspection of the posterior pharynx and briefly past the cords (which were normal in appearance). He was quite uncomfortable at this level of sedation and my decision was to abort the procedure under conscious sedation and instead move to general  anesthesia. This will allow for better sedation, a secure respiratory status and also EBUS which should be helpful in biopsying his R Hilar lesion.  There were no obvious complications.   Samples: NONE  Plans:  I have scheduled FOB + EBUS under general anesthesia on 08/25/13 at 9:30. This can be done inpatient or as an outpatient if plans are for him to be discharged prior to that date.    Levy Pupa, MD, PhD 08/23/2013, 12:40 PM [facility] [phone] or if no answer [phone]

## 2013-08-23 NOTE — Progress Notes (Signed)
  Echocardiogram 2D Echocardiogram has been performed.  Jeffrey Benitez 08/23/2013, 9:21 AM

## 2013-08-23 NOTE — Progress Notes (Signed)
Imaging further reviewed by Dr. Fredia Sorrow. He does not feel this is a good perc biopsy candidate. He would have higher yield via bronchoscopy. Should also have further workup with contrasted CT of abdomen and/or PET There are two suspicious areas in liver on the non-contrasted abdominal CT which could represent mets. Dr. Fredia Sorrow has d/w Brett Canales Minor from [facility] that bronchoscopy be considered first before perc biopsy.

## 2013-08-23 NOTE — Progress Notes (Signed)
Patient ID: Jeffrey Benitez, male   DOB: 02-Aug-1966, 47 y.o.   MRN: [id number]  CT studies reviewed for possible percutaneous lung biopsy.  Do not feel that patient is an appropriate candidate for percutaneous biopsy first over bronchoscopy.  Areas of peripheral consolidation/density may not represent tumor, and only anterior RUL opacity approachable percutaneously.  Lateral opacity not in an approachable location given patient's size.  Would consider PET evaluation as well as contrast enhanced CT of abdomen and pelvis for more complete evaluation, given potential liver mets on the unenhanced CT from 10/20.

## 2013-08-23 NOTE — H&P (View-Only) (Signed)
PULMONARY  / CRITICAL CARE MEDICINE  Name: Jeffrey Benitez MRN: [id number] DOB: 25-Feb-1966    ADMISSION DATE:  08/21/2013 CONSULTATION DATE:  08/22/2013  REFERRING MD : triad PRIMARY SERVICE: triad  CHIEF COMPLAINT:  Back pain  BRIEF PATIENT DESCRIPTION:  47 yo male former smoker presented with abd/flank pain from nephrolithiasis.  Found to have lung mass on CT abd >> confirmed on CT chest.  PCCM consulted to further assess.  SIGNIFICANT EVENTS: 10/20 Admit  10/21 PCCM consult  STUDIES:  10/21 CT chest >> 5.2 cm RUL mass, 4.7 cm Rt peripheral mass, 1.5 cm LUL nodule, Rt hilar/subcarinal/paratracheal LAN  HISTORY OF PRESENT ILLNESS:   47 yo WM former smoker with a pmh of kidney stones,Htn,DM,CHF, diverticulosis with rupture, lap/colostomy/ takedown reanastomosis(2012),and MO. He presented 10-20 with back pain(assumed kidney stones) and CT revealed stones plus large lung masses. CT chest with multiple lung masses bilateral lungs. He is a former smoker (2ppd x 12 years ,quit age 47) and is disabled due to MO and SOB. PCCM asked to evaluate for fob tbbx for pathology. He notes increasing sob over 3 years along with weight gain. No sputum production. No toxic    PAST MEDICAL HISTORY :  Past Medical History  Diagnosis Date  . Kidney stones   . Hypertension   . Diabetes mellitus without complication   . CHF (congestive heart failure)   . Perforated sigmoid colon   . Diverticulitis    Past Surgical History  Procedure Laterality Date  . Kidney stone surgery    . Colon surgery    . Colon ressection     Prior to Admission medications   Medication Sig Start Date End Date Taking? Authorizing Provider  FLUoxetine (PROZAC) 20 MG capsule Take 20 mg by mouth daily.   Yes Historical Provider, MD  furosemide (LASIX) 20 MG tablet Take 20 mg by mouth daily.   Yes Historical Provider, MD  hydrALAZINE (APRESOLINE) 100 MG tablet Take 100 mg by mouth 2 (two) times daily.   Yes Historical  Provider, MD  lisinopril (PRINIVIL,ZESTRIL) 40 MG tablet Take 40 mg by mouth daily.   Yes Historical Provider, MD  LORazepam (ATIVAN) 0.5 MG tablet Take 0.5 mg by mouth 2 (two) times daily as needed for anxiety.   Yes Historical Provider, MD  metFORMIN (GLUCOPHAGE) 500 MG tablet Take 500 mg by mouth 2 (two) times daily with a meal.   Yes Historical Provider, MD  metoprolol (LOPRESSOR) 100 MG tablet Take 100 mg by mouth 2 (two) times daily.   Yes Historical Provider, MD  oxyCODONE-acetaminophen (PERCOCET) 10-325 MG per tablet Take 1 tablet by mouth every 4 (four) hours as needed for pain.   Yes Historical Provider, MD  testosterone cypionate (DEPOTESTOTERONE CYPIONATE) 100 MG/ML injection Inject 100 mg into the muscle every 14 (fourteen) days. For IM use only   Yes Historical Provider, MD   No Known Allergies  FAMILY HISTORY:  Family History  Problem Relation Age of Onset  . Diabetes type II Mother   . Hypertension Father   . Diabetes type II Father   . Heart disease Father    SOCIAL HISTORY:  reports that he has quit smoking. He quit smokeless tobacco use about 14 years ago. He reports that he drinks alcohol. He reports that he does not use illicit drugs.  REVIEW OF SYSTEMS:  .10 point review of system taken, please see HPI for positives and negatives.   SUBJECTIVE:  NAD VITAL SIGNS: Temp:  [97.5 F (  36.4 C)-98.4 F (36.9 C)] 97.6 F (36.4 C) (10/21 0551) Pulse Rate:  [71-89] 89 (10/21 0551) Resp:  [14-26] 22 (10/21 0551) BP: (104-192)/(53-117) 190/104 mmHg (10/21 0551) SpO2:  [92 %-98 %] 98 % (10/21 0551) Weight:  [320 lb (145.151 kg)] 320 lb (145.151 kg) (10/21 0551) Room air  INTAKE / OUTPUT: Intake/Output     10/20 0701 - 10/21 0700 10/21 0701 - 10/22 0700   Total Intake(mL/kg)     Net   0          PHYSICAL EXAMINATION: General:  MO (320 lbs) NAD at rest Neuro:  Intact HEENT:  NO LAN Cardiovascular:  HSR RRR Lungs:  CTA Abdomen:  Obese and old scars  noted Musculoskeletal: Intact Skin:  warm  LABS:  CBC Recent Labs     08/21/13  2220  08/22/13  0622  WBC  8.6  9.0  HGB  15.4  14.7  HCT  45.8  44.4  PLT  244  240   Coag's No results found for this basename: APTT, INR,  in the last 72 hours  BMET Recent Labs     08/21/13  2220  08/22/13  0622  NA  138  137  K  3.7  3.7  CL  96  97  CO2  30  30  BUN  18  17  CREATININE  1.31  1.16  GLUCOSE  122*  112*   Electrolytes Recent Labs     08/21/13  2220  08/22/13  0622  CALCIUM  9.7  9.4  MG   --   1.9  PHOS   --   3.5   ABG Recent Labs     08/22/13  0050  PHART  7.411  PCO2ART  49.1*  PO2ART  58.0*   Liver Enzymes Recent Labs     08/21/13  2220  08/22/13  0622  AST  21  16  ALT  17  11  ALKPHOS  110  111  BILITOT  0.4  0.6  ALBUMIN  3.5  3.5   Cardiac Enzymes Recent Labs     08/21/13  2220  TROPONINI  <0.30  PROBNP  1325.0*   Glucose Recent Labs     08/22/13  0628  08/22/13  0803  GLUCAP  114*  114*    Imaging Ct Abdomen Pelvis Wo Contrast  08/21/2013   CLINICAL DATA:  Nephrolithiasis. Left flank pain  EXAM: CT ABDOMEN AND PELVIS WITHOUT CONTRAST  TECHNIQUE: Multidetector CT imaging of the abdomen and pelvis was performed following the standard protocol without intravenous contrast.  COMPARISON:  CT 07/08/2010  FINDINGS: Nonobstructing small renal calculi on the left. 2 mm stones are present left upper, mid, and lower pole. No ureteral calculi. No right renal calculi. 2 cm right renal cyst.  1 cm hypodensity right lobe of the liver posteriorly is indeterminate but probably benign. This is not seen on the prior study however there was significant streak artifact through the liver. Pancreas and spleen are normal.  Negative for bowel obstruction. Appendix is normal. No free fluid, mass, or adenopathy.  2 cm pleural based density right lateral lung base, not definitely confirmed on prior studies. This has soft tissue density. CT chest is  recommended for further evaluation.  IMPRESSION: 3 nonobstructive stones left kidney.  No ureteral calculi.  2 cm pleural based density right lateral lung base. Recommend chest CT for further evaluation.   Electronically Signed   By: Marlan Palau M.D.  On: 08/21/2013 23:23   Dg Chest 2 View  08/22/2013   CLINICAL DATA:  Short of breath, hypoxemia  EXAM: CHEST  2 VIEW  COMPARISON:  09/12/2010. CT abdomen today 8  FINDINGS: Abnormal density on the right. There is a probable right upper lobe mass measuring approximately 3 cm with right peritracheal adenopathy. There is right pleural thickening. These findings were not present in 2011. No layering effusion.  Cardiac enlargement without heart failure.  Left lung clear.  IMPRESSION: Right lung density with right peritracheal adenopathy. Findings are worrisome for carcinoma of the lung. CT chest with contrast recommended for further evaluation.   Electronically Signed   By: Marlan Palau M.D.   On: 08/22/2013 00:35   Ct Chest W Contrast  08/22/2013   CLINICAL DATA:  Shortness of breath. Chest mass on x-ray.  EXAM: CT CHEST WITH CONTRAST  TECHNIQUE: Multidetector CT imaging of the chest was performed during intravenous contrast administration.  CONTRAST:  OMNIPAQUE IOHEXOL 300 MG/ML  SOLN  COMPARISON:  Chest radiograph 08/22/2013  FINDINGS: Multiple bilateral pulmonary masses. The largest mass is in the right upper lung anteriorly, measuring 5.2 x 5.4 cm. Multiple additional right lung masses are present, measuring up to 4.7 x 2.9 cm diameter. A single mass is demonstrated in the left upper lung posteriorly, measuring 1.5 cm in diameter. The findings are consistent with primary and/or metastatic disease.  Right hilar lymphadenopathy with lymph nodes measuring up to about 3.8 x 2.5 cm. Subcarinal lymphadenopathy and right pretracheal lymphadenopathy is also present.  Normal heart size. Normal caliber thoracic aorta. Coronary artery calcification. The  esophagus is decompressed. No pleural effusions. No pneumothorax.  Visualized portions of the upper abdominal organs demonstrate mild fatty infiltration of the liver.  Degenerative changes in the thoracic spine. No destructive bone lesions are appreciated.  IMPRESSION: Right upper lung mass measuring up to 5.4 cm diameter with multiple additional right lung mass is and a single left lung nodule. Lymphadenopathy in the right hilum and mediastinum. Changes are consistent with primary and/ or metastatic disease.   Electronically Signed   By: Burman Nieves M.D.   On: 08/22/2013 02:19    ASSESSMENT / PLAN:  47 yo male former smoker with incidental finding of RUL mass with satellite Rt peripheral mass abutting pleural, mediastinal and hilar adenopathy, and satellite LUL nodule.  Main concern is for malignancy. P: -will ask IR to assess for trans-thoracic needle biopsy of Rt peripheral lung lesion first -if not amenable to biopsy by IR, will then need to arrange for bronchoscopy -okay to resume diet for now  [facility] Minor ACNP Adolph Pollack PCCM Pager [phone] till 3 pm If no answer page [phone] 08/22/2013, 9:06 AM   Reviewed above, examined pt, and documentation changes made as needed.  He has incidental finding of lung mass with adenopathy and satellite lesions.  Concern is for cancer.  He needs tissue sampling.  Options are 1) needle biopsy with IR versus 2) bronchoscopy with +/- EBUS.    Pro/con's of each procedure discussed with pt and his mother.  Safest initial approach likely by trans-thoracic needle biopsy with IR.  Will ask IR to assess >> if biopsy by IR not feasible or if biopsy non-diagnostic, then will need to arrange for bronchoscopy.  Explained to pt that this w/u does not need to be done on inpatient basis, and can be arranged for as outpt once he is medically stable for discharge otherwise.  Will continue to arrange for tissue  sampling while he remains in hospital.  Coralyn Helling,  MD [facility] 08/22/2013, 1:49 PM Pager:  [phone] After 3pm call: [phone]

## 2013-08-23 NOTE — Progress Notes (Signed)
[facility] PROGRESS NOTE  Jeffrey Benitez RUE:[id number] DOB: 01/04/1966 DOA: 08/21/2013 PCP: Neldon Labella, MD  Assessment/Plan: 47 yo male with history of DM, CHF, HTN, diverticular perf, kidney stones, and morbid obesity that presented to the ER with back pain.  CT scan of chest and abdomen that revealed multiple sizable lung masses.  He was subsequently admitted and on exam there was a suspicious nevus on his lower back.  Back Pain - Secondary to lung masses.  Specifically one mass on the right is against his thoracic wall.  -Treat supportively, pain meds.  Mass of Lung -Likely lung cancer given hx of smoking -Pulmonology consulted. Bronchoscopy scheduled for 10/22 -No longer requiring oxygen, sats 94.  Was hypoxic in the emergency department on admission. -Outpatient follow up with oncology.   DM -CBGs are stable -on SSI-S while inpatient -pt on metformin while at home   HTN -poorly controlled htn likely due to pain -continuing home meds -added prn hydralazine   Skin Lesion -concern for melanoma -Appointment scheduled with Dermatology for Monday at 2:00 (Dr. Terri Piedra)   Morbid Obesity -nutrition consult completed.   Code Status: full Family Communication:  Disposition Plan: inpatient  Consultants:  Pulmonology   Procedures:  Bronchoscopy scheduled for 10/22  Antibiotics:  none  HPI/Subjective: Patient complains of pain with movement and breathing.  Pain medications are not lasting long enough.  Objective: Filed Vitals:   08/23/13 1053  BP: 174/104  Pulse:   Temp:   Resp:     Intake/Output Summary (Last 24 hours) at 08/23/13 1155 Last data filed at 08/22/13 2200  Gross per 24 hour  Intake    340 ml  Output      0 ml  Net    340 ml   Filed Weights   08/21/13 1916 08/22/13 0551  Weight: 145.151 kg (320 lb) 145.151 kg (320 lb)    Exam:   General:  Asleep, but awakens easily, pleasant, obese.  Cardiovascular: RRR, no m/g/r,  no extremity edema noted.  Respiratory: clear to auscultation bilaterally, no accessory muscle use.  No using n/c oxygen.  Abdomen: large panus, positive bowel sounds, soft to palpation, no pain, well healed scar from colostomy take down.  Musculoskeletal: all extremities mobile, with normal muscle tone and strength  Skin: approx. 1 cm nevus mid back at level of approx L4 with blue black color changes suggestive of melanoma   Data Reviewed: Basic Metabolic Panel:  Recent Labs Lab 08/21/13 2220 08/22/13 0622  NA 138 137  K 3.7 3.7  CL 96 97  CO2 30 30  GLUCOSE 122* 112*  BUN 18 17  CREATININE 1.31 1.16  CALCIUM 9.7 9.4  MG  --  1.9  PHOS  --  3.5   Liver Function Tests:  Recent Labs Lab 08/21/13 2220 08/22/13 0622  AST 21 16  ALT 17 11  ALKPHOS 110 111  BILITOT 0.4 0.6  PROT 7.5 7.2  ALBUMIN 3.5 3.5    Recent Labs Lab 08/21/13 2220  LIPASE 46   CBC:  Recent Labs Lab 08/21/13 2220 08/22/13 0622  WBC 8.6 9.0  NEUTROABS 6.0  --   HGB 15.4 14.7  HCT 45.8 44.4  MCV 82.7 83.8  PLT 244 240   Cardiac Enzymes:  Recent Labs Lab 08/21/13 2220  TROPONINI <0.30   BNP (last 3 results)  Recent Labs  08/21/13 2220  PROBNP 1325.0*   CBG:  Recent Labs Lab 08/22/13 1659 08/22/13 1934 08/23/13 0002 08/23/13 0353 08/23/13 0751  GLUCAP 205* 137* 163* 128* 135*       Studies:  2D Echo 10/22 Study Conclusions  - Left ventricle: The cavity size was normal. Wall thickness was increased in a pattern of moderate LVH. The estimated ejection fraction was 60%. Wall motion was normal; there were no regional wall motion abnormalities. - Left atrium: The atrium was mildly dilated.  - Right ventricle: The cavity size was normal. Systolic function was normal.    Ct Abdomen Pelvis Wo Contrast  08/21/2013   CLINICAL DATA:  Nephrolithiasis. Left flank pain  EXAM: CT ABDOMEN AND PELVIS WITHOUT CONTRAST  TECHNIQUE: Multidetector CT imaging of the abdomen and  pelvis was performed following the standard protocol without intravenous contrast.  COMPARISON:  CT 07/08/2010  FINDINGS: Nonobstructing small renal calculi on the left. 2 mm stones are present left upper, mid, and lower pole. No ureteral calculi. No right renal calculi. 2 cm right renal cyst.  1 cm hypodensity right lobe of the liver posteriorly is indeterminate but probably benign. This is not seen on the prior study however there was significant streak artifact through the liver. Pancreas and spleen are normal.  Negative for bowel obstruction. Appendix is normal. No free fluid, mass, or adenopathy.  2 cm pleural based density right lateral lung base, not definitely confirmed on prior studies. This has soft tissue density. CT chest is recommended for further evaluation.  IMPRESSION: 3 nonobstructive stones left kidney.  No ureteral calculi.  2 cm pleural based density right lateral lung base. Recommend chest CT for further evaluation.   Electronically Signed   By: Marlan Palau M.D.   On: 08/21/2013 23:23   Dg Chest 2 View  08/22/2013   CLINICAL DATA:  Short of breath, hypoxemia  EXAM: CHEST  2 VIEW  COMPARISON:  09/12/2010. CT abdomen today 8  FINDINGS: Abnormal density on the right. There is a probable right upper lobe mass measuring approximately 3 cm with right peritracheal adenopathy. There is right pleural thickening. These findings were not present in 2011. No layering effusion.  Cardiac enlargement without heart failure.  Left lung clear.  IMPRESSION: Right lung density with right peritracheal adenopathy. Findings are worrisome for carcinoma of the lung. CT chest with contrast recommended for further evaluation.   Electronically Signed   By: Marlan Palau M.D.   On: 08/22/2013 00:35   Ct Chest W Contrast  08/22/2013   CLINICAL DATA:  Shortness of breath. Chest mass on x-ray.  EXAM: CT CHEST WITH CONTRAST  TECHNIQUE: Multidetector CT imaging of the chest was performed during intravenous contrast  administration.  CONTRAST:  OMNIPAQUE IOHEXOL 300 MG/ML  SOLN  COMPARISON:  Chest radiograph 08/22/2013  FINDINGS: Multiple bilateral pulmonary masses. The largest mass is in the right upper lung anteriorly, measuring 5.2 x 5.4 cm. Multiple additional right lung masses are present, measuring up to 4.7 x 2.9 cm diameter. A single mass is demonstrated in the left upper lung posteriorly, measuring 1.5 cm in diameter. The findings are consistent with primary and/or metastatic disease.  Right hilar lymphadenopathy with lymph nodes measuring up to about 3.8 x 2.5 cm. Subcarinal lymphadenopathy and right pretracheal lymphadenopathy is also present.  Normal heart size. Normal caliber thoracic aorta. Coronary artery calcification. The esophagus is decompressed. No pleural effusions. No pneumothorax.  Visualized portions of the upper abdominal organs demonstrate mild fatty infiltration of the liver.  Degenerative changes in the thoracic spine. No destructive bone lesions are appreciated.  IMPRESSION: Right upper lung mass measuring up  to 5.4 cm diameter with multiple additional right lung mass is and a single left lung nodule. Lymphadenopathy in the right hilum and mediastinum. Changes are consistent with primary and/ or metastatic disease.   Electronically Signed   By: Burman Nieves M.D.   On: 08/22/2013 02:19    Scheduled Meds: . docusate sodium  100 mg Oral BID  . [START ON 08/24/2013] enoxaparin (LOVENOX) injection  70 mg Subcutaneous Q24H  . feeding supplement (PRO-STAT SUGAR FREE 64)  30 mL Oral BID WC  . FLUoxetine  20 mg Oral Daily  . furosemide  20 mg Oral Daily  . hydrALAZINE  10 mg Intravenous Once  . hydrALAZINE  100 mg Oral BID  . influenza vac split quadrivalent PF  0.5 mL Intramuscular Tomorrow-1000  . insulin aspart  0-9 Units Subcutaneous Q4H  . lisinopril  40 mg Oral Daily  . metoprolol  100 mg Oral BID  . pneumococcal 23 valent vaccine  0.5 mL Intramuscular Tomorrow-1000    Continuous Infusions:   Principal Problem:   Mass of lung Active Problems:   Obesity   Hypertension   Diabetes mellitus   Hypoxia   Skin lesion of back    Conley Canal [facility] Pager [phone]. If 7PM-7AM, please contact night-coverage at [email or web address], password [facility] 08/23/2013, 11:55 AM  LOS: 2 days   Attending Patient seen and examined, agree with the above assessment and plan as outlined above. Need to do bronchoscopy in the OR-scheduled for 10/24. Otherwise stable, need to transition to oral narcotics.   Windell Norfolk MD

## 2013-08-23 NOTE — Progress Notes (Signed)
Video bronchoscopy procedure terminated per Dr Delton Coombes due to sedation and maintaing airway issues. Pt stable at this time and waking up

## 2013-08-24 ENCOUNTER — Encounter (HOSPITAL_COMMUNITY): Payer: Self-pay | Admitting: Emergency Medicine

## 2013-08-24 DIAGNOSIS — R109 Unspecified abdominal pain: Secondary | ICD-10-CM

## 2013-08-24 DIAGNOSIS — R222 Localized swelling, mass and lump, trunk: Secondary | ICD-10-CM

## 2013-08-24 LAB — BASIC METABOLIC PANEL
BUN: 14 mg/dL (ref 6–23)
Calcium: 9.2 mg/dL (ref 8.4–10.5)
Chloride: 93 mEq/L — ABNORMAL LOW (ref 96–112)
Creatinine, Ser: 0.97 mg/dL (ref 0.50–1.35)
GFR calc Af Amer: >90 mL/min (ref >90)

## 2013-08-24 LAB — SURGICAL PCR SCREEN: MRSA, PCR: NEGATIVE

## 2013-08-24 LAB — GLUCOSE, CAPILLARY
Glucose-Capillary: 124 mg/dL — ABNORMAL HIGH (ref 70–99)
Glucose-Capillary: 176 mg/dL — ABNORMAL HIGH (ref 70–99)

## 2013-08-24 MED ORDER — OXYCODONE HCL 5 MG PO TABS
5.0000 mg | ORAL_TABLET | ORAL | Status: DC | PRN
  Administered 2013-08-24: 5 mg via ORAL
  Filled 2013-08-24: qty 1

## 2013-08-24 MED ORDER — FENTANYL 25 MCG/HR TD PT72
25.0000 ug | MEDICATED_PATCH | TRANSDERMAL | Status: DC
  Administered 2013-08-24: 25 ug via TRANSDERMAL
  Filled 2013-08-24: qty 1

## 2013-08-24 MED ORDER — FENTANYL 12 MCG/HR TD PT72: 12.5000 ug | MEDICATED_PATCH | TRANSDERMAL | Status: DC

## 2013-08-24 MED ORDER — FUROSEMIDE 40 MG PO TABS
40.0000 mg | ORAL_TABLET | Freq: Every day | ORAL | Status: DC
  Administered 2013-08-26: 40 mg via ORAL
  Filled 2013-08-24 (×2): qty 1

## 2013-08-24 NOTE — Progress Notes (Addendum)
[facility] PROGRESS NOTE  Jeffrey Benitez RUE:[id number] DOB: 1966/06/28 DOA: 08/21/2013 PCP: Neldon Labella, MD  Assessment/Plan: 47 yo male with history of DM, CHF, HTN, diverticular perf, kidney stones, and morbid obesity that presented to the ER with back pain. CT scan of chest and abdomen that revealed multiple sizable lung masses. He was subsequently admitted and on exam there was a suspicious nevus on his lower back.   Back Pain -likely secondary to lung masses; one mass located on thoracic wall - Further workup can be done in the outpatient setting, currently has good strength in all 4 extremities. -treating with oral pain meds and fentanyl patch.  IV pain meds discontinued.  Mass of lung -likely lung cancer; hx of smoking -pulmonology consulted, bronchoscopy attempted 10/22 and unsuccessful -pt scheduled for surgical biopsy on Fri 10/24 -no longer on oxygen, sats at 93, RR 22; has been hypoxic intermittently throughout hospital stay -scheduling outpatient follow up with oncology  DM -CBGs have been stable around 120 -SSI-S while inpatient -on Metformin while at home  HTN -very poorly controlled due to weight and pain -continuing home meds.  Have increased hydralazine dose to 100 mg TID this admission, have also increased Lasix to 40 mg. -PRN Hydralazine as well.  Skin Lesion -possible melanoma -dermatology appt set for Mon 10/27 w/ Dr. Terri Piedra   Code Status: full Family Communication: mother in room Disposition Plan: inpatient  Consultants:  pulmonology  Procedures: Bronchoscopy attempted 10/22  Antibiotics: None  HPI/Subjective: Complaining of pain and constipation.  Objective: Filed Vitals:   08/24/13 0609  BP: 160/90  Pulse:   Temp:   Resp:     Intake/Output Summary (Last 24 hours) at 08/24/13 1216 Last data filed at 08/24/13 0443  Gross per 24 hour  Intake    180 ml  Output      1 ml  Net    179 ml   Filed Weights   08/21/13  1916 08/22/13 0551  Weight: 145.151 kg (320 lb) 145.151 kg (320 lb)    Exam:    General:  Sitting up right, obese   Cardiovascular: RRR, no m/g/r  Respiratory: few diffuse crackles bilaterally, no inc work of breathing while sitting  Abdomen: protuberant, nontender, no masses, +BS  Musculoskeletal: all 4 extremities mobile and intact, no dec in ROM, no edema  Skin:  approx 1 cm blue black, irregularly shaped nevus on lower back suspicious for melanoma  Data Reviewed: Basic Metabolic Panel:  Recent Labs Lab 08/21/13 2220 08/22/13 0622 08/24/13 0725  NA 138 137 132*  K 3.7 3.7 4.0  CL 96 97 93*  CO2 30 30 31   GLUCOSE 122* 112* 140*  BUN 18 17 14   CREATININE 1.31 1.16 0.97  CALCIUM 9.7 9.4 9.2  MG  --  1.9  --   PHOS  --  3.5  --    Liver Function Tests:  Recent Labs Lab 08/21/13 2220 08/22/13 0622  AST 21 16  ALT 17 11  ALKPHOS 110 111  BILITOT 0.4 0.6  PROT 7.5 7.2  ALBUMIN 3.5 3.5    Recent Labs Lab 08/21/13 2220  LIPASE 46   CBC:  Recent Labs Lab 08/21/13 2220 08/22/13 0622  WBC 8.6 9.0  NEUTROABS 6.0  --   HGB 15.4 14.7  HCT 45.8 44.4  MCV 82.7 83.8  PLT 244 240   Cardiac Enzymes:  Recent Labs Lab 08/21/13 2220  TROPONINI <0.30   BNP (last 3 results)  Recent Labs  08/21/13  2220  PROBNP 1325.0*   CBG:  Recent Labs Lab 08/23/13 1356 08/23/13 1607 08/23/13 1958 08/23/13 2340 08/24/13 0757  GLUCAP 123* 133* 198* 119* 124*      Studies: No results found.  Scheduled Meds: . docusate sodium  100 mg Oral BID  . feeding supplement (PRO-STAT SUGAR FREE 64)  30 mL Oral TID WC  . fentaNYL  25 mcg Transdermal Q72H  . FLUoxetine  20 mg Oral Daily  . furosemide  20 mg Oral Daily  . hydrALAZINE  100 mg Oral Q8H  . insulin aspart  0-9 Units Subcutaneous TID WC  . lisinopril  40 mg Oral Daily  . metoprolol tartrate  100 mg Oral BID   Continuous Infusions:   Principal Problem:   Mass of lung Active Problems:    Obesity   Hypertension   Diabetes mellitus   Hypoxia   Skin lesion of back     Dorinda Hill, PA-S  Algis Downs, PA-C [facility] Pager [phone]. If 7PM-7AM, please contact night-coverage at [email or web address], password [facility] 08/24/2013, 12:16 PM  LOS: 3 days     Attending - Patient seen and examined, the with the above assessment and plan. Doing much better with pain control. For bronchoscopy tomorrow, PCCM following.  S Shalisha Clausing

## 2013-08-24 NOTE — Consult Note (Signed)
PULMONARY  / CRITICAL CARE MEDICINE  Name: Jeffrey Benitez MRN: [id number] DOB: 01/17/1966    ADMISSION DATE:  08/21/2013 CONSULTATION DATE:  08/22/2013  REFERRING MD : [facility] PRIMARY SERVICE: [facility]  CHIEF COMPLAINT:  Back pain  BRIEF PATIENT DESCRIPTION:  47 yo male former smoker presented with abd/flank pain from nephrolithiasis.  Found to have lung mass on CT abd >> confirmed on CT chest.  PCCM consulted to further assess.  SIGNIFICANT EVENTS: 10/20 - Admit  10/21 - PCCM consult for incidental finding of lung mass 10/22 - FOB, unable to complete bx due to consciousness / sedation risk with body habitus 10/24 - FOB + EBUS  STUDIES:  10/21 - CT chest >> 5.2 cm RUL mass, 4.7 cm Rt peripheral mass, 1.5 cm LUL nodule, Rt hilar/subcarinal/paratracheal LAN   SUBJECTIVE: No acute events.   VITAL SIGNS: Temp:  [97.5 F (36.4 C)-97.9 F (36.6 C)] 97.5 F (36.4 C) (10/23 0428) Pulse Rate:  [72-96] 72 (10/23 0428) Resp:  [9-25] 22 (10/23 0428) BP: (147-220)/(48-144) 160/90 mmHg (10/23 0609) SpO2:  [86 %-97 %] 93 % (10/23 0428) Room air  INTAKE / OUTPUT: Intake/Output     10/22 0701 - 10/23 0700 10/23 0701 - 10/24 0700   P.O. 180    Total Intake(mL/kg) 180 (1.2)    Stool 1    Total Output 1     Net +179            PHYSICAL EXAMINATION: General:  MO (320 lbs) NAD at rest Neuro:  Intact HEENT:  NO LAN Cardiovascular:  HSR RRR Lungs:  CTA Abdomen:  Obese and old scars noted Musculoskeletal: Intact Skin:  warm  LABS:  CBC Recent Labs     08/21/13  2220  08/22/13  0622  WBC  8.6  9.0  HGB  15.4  14.7  HCT  45.8  44.4  PLT  244  240   Coag's Recent Labs     08/23/13  0525  APTT  39*  INR  1.13    BMET Recent Labs     08/21/13  2220  08/22/13  0622  08/24/13  0725  NA  138  137  132*  K  3.7  3.7  4.0  CL  96  97  93*  CO2  30  30  31  BUN  18  17  14  CREATININE  1.31  1.16  0.97  GLUCOSE  122*  112*  140*   Electrolytes Recent Labs   08/21/13  2220  08/22/13  0622  08/24/13  0725  CALCIUM  9.7  9.4  9.2  MG   --   1.9   --   PHOS   --   3.5   --    ABG Recent Labs     08/22/13  0050  PHART  7.411  PCO2ART  49.1*  PO2ART  58.0*   Liver Enzymes Recent Labs     08/21/13  2220  08/22/13  0622  AST  21  16  ALT  17  11  ALKPHOS  110  111  BILITOT  0.4  0.6  ALBUMIN  3.5  3.5   Cardiac Enzymes Recent Labs     08/21/13  2220  TROPONINI  <0.30  PROBNP  1325.0*   Glucose Recent Labs     08/23/13  0751  08/23/13  1356  08/23/13  1607  08/23/13  1958  08/23/13  2340  08/24/13  0757    GLUCAP  135*  123*  133*  198*  119*  124*    Imaging No results found.  ASSESSMENT / PLAN:   Tobacco Abuse RUL Mass with Satellite Lesions Mediastinal / Hilar Adenopathy LUL Pulmonary Nodule  P: -plan for FOB + EBUS in am 10/24 -NPO after MN -hold any anticoagulation for am procedure   [name], MD, PhD 08/24/2013, 1:14 PM [facility] [phone] or if no answer [phone]   

## 2013-08-25 ENCOUNTER — Inpatient Hospital Stay (HOSPITAL_COMMUNITY): Payer: Medicaid Other

## 2013-08-25 ENCOUNTER — Encounter (HOSPITAL_COMMUNITY)
Admission: EM | Disposition: A | Discharge status: Home / Self Care | Payer: Self-pay | Source: Home / Self Care | Attending: Internal Medicine

## 2013-08-25 ENCOUNTER — Encounter (HOSPITAL_COMMUNITY): Payer: Self-pay | Admitting: Anesthesiology

## 2013-08-25 ENCOUNTER — Encounter (HOSPITAL_COMMUNITY): Payer: Medicaid Other | Admitting: Anesthesiology

## 2013-08-25 ENCOUNTER — Inpatient Hospital Stay (HOSPITAL_COMMUNITY): Payer: Medicaid Other | Admitting: Anesthesiology

## 2013-08-25 DIAGNOSIS — L989 Disorder of the skin and subcutaneous tissue, unspecified: Secondary | ICD-10-CM

## 2013-08-25 HISTORY — PX: VIDEO BRONCHOSCOPY WITH ENDOBRONCHIAL ULTRASOUND: SHX6177

## 2013-08-25 LAB — GLUCOSE, CAPILLARY
Glucose-Capillary: 131 mg/dL — ABNORMAL HIGH (ref 70–99)
Glucose-Capillary: 127 mg/dL — ABNORMAL HIGH (ref 70–99)
Glucose-Capillary: 131 mg/dL — ABNORMAL HIGH (ref 70–99)
Glucose-Capillary: 144 mg/dL — ABNORMAL HIGH (ref 70–99)
Glucose-Capillary: 114 mg/dL — ABNORMAL HIGH (ref 70–99)

## 2013-08-25 LAB — BASIC METABOLIC PANEL
BUN: 13 mg/dL (ref 6–23)
CO2: 27 mEq/L (ref 19–32)
Chloride: 96 mEq/L (ref 96–112)
GFR calc Af Amer: >90 mL/min (ref >90)
GFR calc non Af Amer: >90 mL/min (ref >90)
Potassium: 3.8 mEq/L (ref 3.5–5.1)
Sodium: 135 mEq/L (ref 135–145)

## 2013-08-25 SURGERY — BRONCHOSCOPY, WITH EBUS
Anesthesia: General | Site: Bronchus | Laterality: Right | Wound class: Clean Contaminated

## 2013-08-25 MED ORDER — SUCCINYLCHOLINE CHLORIDE 20 MG/ML IJ SOLN
250.0000 mg | INTRAVENOUS | Status: DC | PRN
  Administered 2013-08-25: 100 mg via INTRAVENOUS

## 2013-08-25 MED ORDER — GLYCOPYRROLATE 0.2 MG/ML IJ SOLN
INTRAMUSCULAR | Status: DC | PRN
  Administered 2013-08-25: 0.4 mg via INTRAVENOUS

## 2013-08-25 MED ORDER — MIDAZOLAM HCL 5 MG/5ML IJ SOLN
INTRAMUSCULAR | Status: DC | PRN
  Administered 2013-08-25: 2 mg via INTRAVENOUS

## 2013-08-25 MED ORDER — FENTANYL CITRATE 0.05 MG/ML IJ SOLN
INTRAMUSCULAR | Status: DC | PRN
  Administered 2013-08-25: 100 ug via INTRAVENOUS
  Administered 2013-08-25: 150 ug via INTRAVENOUS

## 2013-08-25 MED ORDER — LACTATED RINGERS IV SOLN
INTRAVENOUS | Status: DC
  Administered 2013-08-25: 10:00:00 via INTRAVENOUS

## 2013-08-25 MED ORDER — LORAZEPAM 2 MG/ML IJ SOLN: 0.5000 mg | Freq: Once | INTRAMUSCULAR | Status: DC

## 2013-08-25 MED ORDER — PROPOFOL 10 MG/ML IV BOLUS
INTRAVENOUS | Status: DC | PRN
  Administered 2013-08-25: 200 mg via INTRAVENOUS

## 2013-08-25 MED ORDER — OXYCODONE HCL 5 MG PO TABS
5.0000 mg | ORAL_TABLET | ORAL | Status: DC | PRN
  Administered 2013-08-25 – 2013-08-26 (×2): 5 mg via ORAL
  Filled 2013-08-25 (×2): qty 1

## 2013-08-25 MED ORDER — LORAZEPAM 2 MG/ML IJ SOLN
1.0000 mg | Freq: Once | INTRAMUSCULAR | Status: AC
  Administered 2013-08-26: 07:00:00 1 mg via INTRAVENOUS
  Filled 2013-08-25: qty 1

## 2013-08-25 MED ORDER — BUTALBITAL-APAP-CAFFEINE 50-325-40 MG PO TABS
1.0000 | ORAL_TABLET | Freq: Four times a day (QID) | ORAL | Status: DC | PRN
Start: 2013-08-25 — End: 2013-08-26
  Administered 2013-08-26: 1 via ORAL
  Filled 2013-08-25: qty 1

## 2013-08-25 MED ORDER — DEXMEDETOMIDINE HCL 200 MCG/2ML IV SOLN
INTRAVENOUS | Status: DC | PRN
  Administered 2013-08-25 (×2): 35 ug via INTRAVENOUS

## 2013-08-25 MED ORDER — PROPOFOL INFUSION 10 MG/ML OPTIME
INTRAVENOUS | Status: DC | PRN
  Administered 2013-08-25: 75 ug/kg/min via INTRAVENOUS

## 2013-08-25 MED ORDER — SUCCINYLCHOLINE CHLORIDE 20 MG/ML IJ SOLN
INTRAMUSCULAR | Status: DC | PRN
  Administered 2013-08-25: 100 mg via INTRAVENOUS

## 2013-08-25 MED ORDER — PHENYLEPHRINE HCL 10 MG/ML IJ SOLN
10.0000 mg | INTRAVENOUS | Status: DC | PRN
  Administered 2013-08-25: 20 ug/min via INTRAVENOUS

## 2013-08-25 MED ORDER — LIDOCAINE HCL (CARDIAC) 20 MG/ML IV SOLN
INTRAVENOUS | Status: DC | PRN
  Administered 2013-08-25: 50 mg via INTRAVENOUS

## 2013-08-25 MED ORDER — DEXMEDETOMIDINE HCL IN NACL 200 MCG/50ML IV SOLN
0.4000 ug/kg/h | INTRAVENOUS | Status: DC
  Filled 2013-08-25: qty 50

## 2013-08-25 MED ORDER — LACTATED RINGERS IV SOLN
INTRAVENOUS | Status: DC | PRN
  Administered 2013-08-25: 10:00:00 via INTRAVENOUS

## 2013-08-25 MED ORDER — DEXMEDETOMIDINE HCL IN NACL 200 MCG/50ML IV SOLN
INTRAVENOUS | Status: DC | PRN
  Administered 2013-08-25: 0.5 ug/kg/h via INTRAVENOUS

## 2013-08-25 MED ORDER — 0.9 % SODIUM CHLORIDE (POUR BTL) OPTIME
TOPICAL | Status: DC | PRN
  Administered 2013-08-25: 1000 mL

## 2013-08-25 MED ORDER — LORAZEPAM 2 MG/ML IJ SOLN
0.5000 mg | Freq: Once | INTRAMUSCULAR | Status: AC
  Administered 2013-08-25: 15:00:00 0.5 mg via INTRAVENOUS
  Filled 2013-08-25: qty 1

## 2013-08-25 MED ORDER — ONDANSETRON HCL 4 MG/2ML IJ SOLN
INTRAMUSCULAR | Status: DC | PRN
  Administered 2013-08-25: 4 mg via INTRAVENOUS

## 2013-08-25 MED ORDER — DEXMEDETOMIDINE HCL IN NACL 400 MCG/100ML IV SOLN
0.4000 ug/kg/h | INTRAVENOUS | Status: DC
  Filled 2013-08-25: qty 100

## 2013-08-25 MED ORDER — NEOSTIGMINE METHYLSULFATE 1 MG/ML IJ SOLN
INTRAMUSCULAR | Status: DC | PRN
  Administered 2013-08-25: 3 mg via INTRAVENOUS

## 2013-08-25 MED ORDER — ROCURONIUM BROMIDE 100 MG/10ML IV SOLN
INTRAVENOUS | Status: DC | PRN
  Administered 2013-08-25: 30 mg via INTRAVENOUS

## 2013-08-25 SURGICAL SUPPLY — 22 items
BRUSH CYTOL CELLEBRITY 1.5X140 (MISCELLANEOUS) ×2 IMPLANT
CANISTER SUCTION 2500CC (MISCELLANEOUS) ×2 IMPLANT
CLOTH BEACON ORANGE TIMEOUT ST (SAFETY) ×2 IMPLANT
CONT SPEC 4OZ CLIKSEAL STRL BL (MISCELLANEOUS) ×4 IMPLANT
COVER TABLE BACK 60X90 (DRAPES) ×2 IMPLANT
FORCEPS BIOP RJ4 1.8 (CUTTING FORCEPS) IMPLANT
GLOVE BIOGEL M STRL SZ7.5 (GLOVE) ×2 IMPLANT
GOWN STRL NON-REIN LRG LVL3 (GOWN DISPOSABLE) ×4 IMPLANT
KIT ROOM TURNOVER OR (KITS) ×2 IMPLANT
MARKER SKIN DUAL TIP RULER LAB (MISCELLANEOUS) ×2 IMPLANT
NEEDLE BIOPSY TRANSBRONCH 21G (NEEDLE) IMPLANT
NEEDLE SYS SONOTIP II EBUSTBNA (NEEDLE) ×4 IMPLANT
NS IRRIG 1000ML POUR BTL (IV SOLUTION) ×2 IMPLANT
OIL SILICONE PENTAX (PARTS (SERVICE/REPAIRS)) IMPLANT
PAD ARMBOARD 7.5X6 YLW CONV (MISCELLANEOUS) ×4 IMPLANT
SPONGE GAUZE 4X4 12PLY (GAUZE/BANDAGES/DRESSINGS) IMPLANT
SYR 20CC LL (SYRINGE) ×2 IMPLANT
SYR 20ML ECCENTRIC (SYRINGE) ×4 IMPLANT
SYR 5ML LUER SLIP (SYRINGE) ×2 IMPLANT
TOWEL OR 17X24 6PK STRL BLUE (TOWEL DISPOSABLE) ×2 IMPLANT
TRAP SPECIMEN MUCOUS 40CC (MISCELLANEOUS) ×2 IMPLANT
TUBE CONNECTING 12X1/4 (SUCTIONS) ×4 IMPLANT

## 2013-08-25 NOTE — Progress Notes (Signed)
Pt stated that doing MRI was making him nervous. Tech stated that getting the pt into machine created a tight fit and pt got anxious. He stated that he would be comfortable doing it in the morning. Pt is alert and on unit floor. Will continue to monitor

## 2013-08-25 NOTE — Preoperative (Addendum)
Beta Blockers   Lopressor 100mg  last taken on 08-24-13  @ 2154

## 2013-08-25 NOTE — Interval H&P Note (Signed)
PCCM Interval history Note.   Pt doing well, but he does have a HA. BP has been labile, mostly high. No new breathing issues. He understands why we are moving to general anesthesia, the risks associated with MV with a hx of OSA/OHS. I have indicated that these risks should not preclude a safe procedure and he agrees.   Filed Vitals:   08/24/13 2154 08/25/13 0359 08/25/13 0430 08/25/13 0507  BP: 168/100 170/100 166/94 160/90  Pulse: 90   80  Temp:    98.3 F (36.8 C)  TempSrc:    Oral  Resp:    20  Height:      Weight:      SpO2:    90%   General:  MO (320 lbs) NAD at rest Neuro:  Awake, appropriate HEENT:  NO LAN Cardiovascular:  HSR RRR Lungs:  CTA Abdomen:  Obese and old scars noted Musculoskeletal: Intact Skin:  Warm   Recent Labs Lab 08/21/13 2220 08/22/13 0622  HGB 15.4 14.7  HCT 45.8 44.4  WBC 8.6 9.0  PLT 244 240    Recent Labs Lab 08/21/13 2220 08/22/13 0622 08/24/13 0725 08/25/13 0428  NA 138 137 132* 135  K 3.7 3.7 4.0 3.8  CL 96 97 93* 96  CO2 30 30 31 27   GLUCOSE 122* 112* 140* 128*  BUN 18 17 14 13   CREATININE 1.31 1.16 0.97 0.88  CALCIUM 9.7 9.4 9.2 9.2  MG  --  1.9  --   --   PHOS  --  3.5  --   --     Recent Labs Lab 08/23/13 0525  INR 1.13   Plans:  Will proceed with FOB + EBUS and biopsies under general anesthesia today. Pt understands the plan  Levy Pupa, MD, PhD 08/25/2013, 9:27 AM [facility] [phone] or if no answer [phone]

## 2013-08-25 NOTE — Progress Notes (Signed)
Report given to rebecca rn as caregiver 

## 2013-08-25 NOTE — Op Note (Signed)
Video Bronchoscopy with Endobronchial Ultrasound Procedure Note  Date of Operation: 08/25/2013  Pre-op Diagnosis: RUL mass, R hilar mass  Post-op Diagnosis: same  Surgeon: Levy Pupa  Assistants: none  Anesthesia: General endotracheal anesthesia  Operation: Flexible video fiberoptic bronchoscopy with endobronchial ultrasound and biopsies.  Estimated Blood Loss: 10cc  Complications: none apparent  Indications and History: [name] is a 47 y.o. male with hx back pain, skin lesion on the back concerning for melanoma, CT scan of the chest with B lung masses. The predominant lesions are in the RUL and the R hilum. Recommendation was made to pursue biopsies via FOB with EBUS.  The risks, benefits, complications, treatment options and expected outcomes were discussed with the patient.  The possibilities of pneumothorax, pneumonia, reaction to medication, pulmonary aspiration, perforation of a viscus, bleeding, failure to diagnose a condition and creating a complication requiring transfusion or operation were discussed with the patient who freely signed the consent.    Description of Procedure: The patient was examined in the preoperative area and history and data from the preprocedure consultation were reviewed. It was deemed appropriate to proceed.  The patient was taken to OR10, identified as [name] and the procedure verified as Flexible Video Fiberoptic Bronchoscopy.  A Time Out was held and the above information confirmed. General anesthesia was initiated and the patient  was orally intubated. The video fiberoptic bronchoscope was introduced via the endotracheal tube and a general inspection was performed which showed normal trachea, normal L sided exam. The RUL bronchus showed abnormal vascularity and erythema without any overt endobronchial lesion This extended down to the bronchus intermedius and displaced airways interiorly without occlusion. The segmental RUL carina was  splayed without obvious mass. The standard scope was then withdrawn and the endobronchial ultrasound was used to identify and characterize the peritracheal, hilar and bronchial lymph nodes. Inspection showed a large R hilar mass, significant enlargement of Station 7 and distal R sided nodes. The L side was grossly normal. Using real-time ultrasound guidance Wang needle biopsies were take from the R hilar mass and from Station 7 and 11R nodes and were sent for cytology. The standard scope was then reintroduced to facilitate RUL endobronchial brushings and biopsies. These were sent for pathology. The patient tolerated the procedure well without apparent complications. There was no significant blood loss. The bronchoscope was withdrawn. Anesthesia was reversed and the patient was taken to the PACU for recovery.   Samples: 1. Wang needle biopsies from R Hilar Mass 2. Wang needle biopsies from Station 7 node 3. Wang needle biopsies from Station 11R node 4. Endobronchial biopsies from RUL 5. Endobronchial brushings from RUL  Plans:  The patient will be transferred from the PACU to his hospital bed when recovered from anesthesia. We will review the cytology, pathology results with the patient when they become available.    Levy Pupa, MD, PhD 08/25/2013, 11:44 AM [facility] [phone] or if no answer [phone]

## 2013-08-25 NOTE — Progress Notes (Signed)
PATIENT DETAILS Name: Jeffrey Benitez Age: 47 y.o. Sex: male Date of Birth: Oct 13, 1966 Admit Date: 08/21/2013 Admitting Physician Therisa Doyne, MD ZOX:[id number] Larita Fife, MD  Subjective: No major complaints  Assessment/Plan: Principal Problem:   Mass of lung -suspect malignancy -PCCM consulted -Bronchoscopy with bx attempted on 10/22-unsuccessful-for Bronchoscopy under general anesthesia today -will need Oncology f/u as outpatient-appt with Dr Arbutus Ped scheduled for Nov 3 at 2:15 pm  Back Pain -controlled with narcotics -great strength on all 4 ext -currently getting worked up for lung mass  DM  -CBGs have been stable around 120  -SSI-S while inpatient  -on Metformin while at home  HTN Labile, and moderately controlled -c/w Lasix, Hydralazine, Metoprolol, Lisinopril -will monitor and adjust accordingly  Skin Lesion  -possible melanoma  -dermatology appt set for Mon 10/27 w/ Dr. Gilles Chiquito -counseled regarding importance of weight loss  Disposition: Remain inpatient-home likely 10/25  DVT Prophylaxis: SCD's  Code Status: Full code   Family Communication None at bedside  Procedures:  Bronchoscopy 10/22-unsuccessful   CONSULTS:  pulmonary/intensive care   MEDICATIONS: Scheduled Meds: . [facility] HOLD] docusate sodium  100 mg Oral BID  . [MAR HOLD] feeding supplement (PRO-STAT SUGAR FREE 64)  30 mL Oral TID WC  . [facility] HOLD] fentaNYL  25 mcg Transdermal Q72H  . [MAR HOLD] FLUoxetine  20 mg Oral Daily  . [facility] HOLD] furosemide  40 mg Oral Daily  . [MAR HOLD] hydrALAZINE  100 mg Oral Q8H  . [MAR HOLD] insulin aspart  0-9 Units Subcutaneous TID WC  . [MAR HOLD] lisinopril  40 mg Oral Daily  . [facility] HOLD] metoprolol tartrate  100 mg Oral BID   Continuous Infusions: . lactated ringers 20 mL/hr at 08/25/13 0935   PRN Meds:.0.9 % irrigation (POUR BTL), [MAR HOLD] acetaminophen, [MAR HOLD] albuterol, [MAR HOLD]  butalbital-acetaminophen-caffeine, [MAR HOLD] hydrALAZINE, [MAR HOLD] ondansetron (ZOFRAN) IV, [MAR HOLD] ondansetron, [MAR HOLD] oxyCODONE  Antibiotics: Anti-infectives   None       PHYSICAL EXAM: Vital signs in last 24 hours: Filed Vitals:   08/24/13 2154 08/25/13 0359 08/25/13 0430 08/25/13 0507  BP: 168/100 170/100 166/94 160/90  Pulse: 90   80  Temp:    98.3 F (36.8 C)  TempSrc:    Oral  Resp:    20  Height:      Weight:      SpO2:    90%    Weight change:  Filed Weights   08/21/13 1916 08/22/13 0551  Weight: 145.151 kg (320 lb) 145.151 kg (320 lb)   Body mass index is 47.94 kg/(m^2).   Gen Exam: Awake and alert with clear speech.   Neck: Supple, No JVD.   Chest: B/L Clear.   CVS: S1 S2 Regular, no murmurs.  Abdomen: soft, BS +, non tender, non distended.  Extremities: no edema, lower extremities warm to touch. Neurologic: Non Focal.   Skin: No Rash.   Wounds: N/A.    Intake/Output from previous day:  Intake/Output Summary (Last 24 hours) at 08/25/13 0959 Last data filed at 08/24/13 2200  Gross per 24 hour  Intake    120 ml  Output      0 ml  Net    120 ml     LAB RESULTS: CBC  Recent Labs Lab 08/21/13 2220 08/22/13 0622  WBC 8.6 9.0  HGB 15.4 14.7  HCT 45.8 44.4  PLT 244 240  MCV 82.7 83.8  MCH 27.8 27.7  MCHC 33.6 33.1  RDW 13.9  14.2  LYMPHSABS 1.8  --   MONOABS 0.6  --   EOSABS 0.2  --   BASOSABS 0.0  --     Chemistries   Recent Labs Lab 08/21/13 2220 08/22/13 0622 08/24/13 0725 08/25/13 0428  NA 138 137 132* 135  K 3.7 3.7 4.0 3.8  CL 96 97 93* 96  CO2 30 30 31 27   GLUCOSE 122* 112* 140* 128*  BUN 18 17 14 13   CREATININE 1.31 1.16 0.97 0.88  CALCIUM 9.7 9.4 9.2 9.2  MG  --  1.9  --   --     CBG:  Recent Labs Lab 08/24/13 1138 08/24/13 1728 08/24/13 2149 08/25/13 0752 08/25/13 0945  GLUCAP 127* 125* 176* 131* 127*    GFR Estimated Creatinine Clearance: 144.9 ml/min (by C-G formula based on Cr of  0.88).  Coagulation profile  Recent Labs Lab 08/23/13 0525  INR 1.13    Cardiac Enzymes  Recent Labs Lab 08/21/13 2220  TROPONINI <0.30    No components found with this basename: POCBNP,  No results found for this basename: DDIMER,  in the last 72 hours No results found for this basename: HGBA1C,  in the last 72 hours No results found for this basename: CHOL, HDL, LDLCALC, TRIG, CHOLHDL, LDLDIRECT,  in the last 72 hours No results found for this basename: TSH, T4TOTAL, FREET3, T3FREE, THYROIDAB,  in the last 72 hours No results found for this basename: VITAMINB12, FOLATE, FERRITIN, TIBC, IRON, RETICCTPCT,  in the last 72 hours No results found for this basename: LIPASE, AMYLASE,  in the last 72 hours  Urine Studies No results found for this basename: UACOL, UAPR, USPG, UPH, UTP, UGL, UKET, UBIL, UHGB, UNIT, UROB, ULEU, UEPI, UWBC, URBC, UBAC, CAST, CRYS, UCOM, BILUA,  in the last 72 hours  MICROBIOLOGY: Recent Results (from the past 240 hour(s))  SURGICAL PCR SCREEN     Status: None   Collection Time    08/24/13  3:48 PM      Result Value Range Status   MRSA, PCR NEGATIVE  NEGATIVE Final   Staphylococcus aureus NEGATIVE  NEGATIVE Final   Comment:            The Xpert SA Assay (FDA     approved for NASAL specimens     in patients over 90 years of age),     is one component of     a comprehensive surveillance     program.  Test performance has     been validated by [employer] for patients greater     than or equal to 69 year old.     It is not intended     to diagnose infection nor to     guide or monitor treatment.    RADIOLOGY STUDIES/RESULTS: Ct Abdomen Pelvis Wo Contrast  08/21/2013   CLINICAL DATA:  Nephrolithiasis. Left flank pain  EXAM: CT ABDOMEN AND PELVIS WITHOUT CONTRAST  TECHNIQUE: Multidetector CT imaging of the abdomen and pelvis was performed following the standard protocol without intravenous contrast.  COMPARISON:  CT 07/08/2010  FINDINGS:  Nonobstructing small renal calculi on the left. 2 mm stones are present left upper, mid, and lower pole. No ureteral calculi. No right renal calculi. 2 cm right renal cyst.  1 cm hypodensity right lobe of the liver posteriorly is indeterminate but probably benign. This is not seen on the prior study however there was significant streak artifact through the liver. Pancreas and spleen  are normal.  Negative for bowel obstruction. Appendix is normal. No free fluid, mass, or adenopathy.  2 cm pleural based density right lateral lung base, not definitely confirmed on prior studies. This has soft tissue density. CT chest is recommended for further evaluation.  IMPRESSION: 3 nonobstructive stones left kidney.  No ureteral calculi.  2 cm pleural based density right lateral lung base. Recommend chest CT for further evaluation.   Electronically Signed   By: Marlan Palau M.D.   On: 08/21/2013 23:23   Dg Chest 2 View  08/22/2013   CLINICAL DATA:  Short of breath, hypoxemia  EXAM: CHEST  2 VIEW  COMPARISON:  09/12/2010. CT abdomen today 8  FINDINGS: Abnormal density on the right. There is a probable right upper lobe mass measuring approximately 3 cm with right peritracheal adenopathy. There is right pleural thickening. These findings were not present in 2011. No layering effusion.  Cardiac enlargement without heart failure.  Left lung clear.  IMPRESSION: Right lung density with right peritracheal adenopathy. Findings are worrisome for carcinoma of the lung. CT chest with contrast recommended for further evaluation.   Electronically Signed   By: Marlan Palau M.D.   On: 08/22/2013 00:35   Ct Chest W Contrast  08/22/2013   CLINICAL DATA:  Shortness of breath. Chest mass on x-ray.  EXAM: CT CHEST WITH CONTRAST  TECHNIQUE: Multidetector CT imaging of the chest was performed during intravenous contrast administration.  CONTRAST:  OMNIPAQUE IOHEXOL 300 MG/ML  SOLN  COMPARISON:  Chest radiograph 08/22/2013  FINDINGS:  Multiple bilateral pulmonary masses. The largest mass is in the right upper lung anteriorly, measuring 5.2 x 5.4 cm. Multiple additional right lung masses are present, measuring up to 4.7 x 2.9 cm diameter. A single mass is demonstrated in the left upper lung posteriorly, measuring 1.5 cm in diameter. The findings are consistent with primary and/or metastatic disease.  Right hilar lymphadenopathy with lymph nodes measuring up to about 3.8 x 2.5 cm. Subcarinal lymphadenopathy and right pretracheal lymphadenopathy is also present.  Normal heart size. Normal caliber thoracic aorta. Coronary artery calcification. The esophagus is decompressed. No pleural effusions. No pneumothorax.  Visualized portions of the upper abdominal organs demonstrate mild fatty infiltration of the liver.  Degenerative changes in the thoracic spine. No destructive bone lesions are appreciated.  IMPRESSION: Right upper lung mass measuring up to 5.4 cm diameter with multiple additional right lung mass is and a single left lung nodule. Lymphadenopathy in the right hilum and mediastinum. Changes are consistent with primary and/ or metastatic disease.   Electronically Signed   By: Burman Nieves M.D.   On: 08/22/2013 02:19    Jeoffrey Massed, MD  [facility] Pager:[phone]  If 7PM-7AM, please contact night-coverage [email or web address] Password [email or web address] 08/25/2013, 9:59 AM   LOS: 4 days

## 2013-08-25 NOTE — Anesthesia Procedure Notes (Signed)
Procedure Name: Intubation Date/Time: 08/25/2013 10:21 AM Performed by: Tyrone Nine Pre-anesthesia Checklist: Patient identified, Timeout performed, Emergency Drugs available, Suction available and Patient being monitored Patient Re-evaluated:Patient Re-evaluated prior to inductionOxygen Delivery Method: Circle system utilized Preoxygenation: Pre-oxygenation with 100% oxygen Intubation Type: IV induction Ventilation: Mask ventilation without difficulty and Oral airway inserted - appropriate to patient size Grade View: Grade IV Tube size: 9.0 mm Number of attempts: 1 Airway Equipment and Method: Rigid stylet and Video-laryngoscopy Placement Confirmation: ETT inserted through vocal cords under direct vision,  positive ETCO2 and breath sounds checked- equal and bilateral Secured at: 25 cm Tube secured with: Tape Difficulty Due To: Difficulty was anticipated, Difficult Airway- due to reduced neck mobility and Difficult Airway- due to anterior larynx Future Recommendations: Recommend- induction with short-acting agent, and alternative techniques readily available

## 2013-08-25 NOTE — H&P (View-Only) (Signed)
PULMONARY  / CRITICAL CARE MEDICINE  Name: Jeffrey Benitez MRN: [id number] DOB: 1965-12-29    ADMISSION DATE:  08/21/2013 CONSULTATION DATE:  08/22/2013  REFERRING MD : [facility] PRIMARY SERVICE: [facility]  CHIEF COMPLAINT:  Back pain  BRIEF PATIENT DESCRIPTION:  47 yo male former smoker presented with abd/flank pain from nephrolithiasis.  Found to have lung mass on CT abd >> confirmed on CT chest.  PCCM consulted to further assess.  SIGNIFICANT EVENTS: 10/20 - Admit  10/21 - PCCM consult for incidental finding of lung mass 10/22 - FOB, unable to complete bx due to consciousness / sedation risk with body habitus 10/24 - FOB + EBUS  STUDIES:  10/21 - CT chest >> 5.2 cm RUL mass, 4.7 cm Rt peripheral mass, 1.5 cm LUL nodule, Rt hilar/subcarinal/paratracheal LAN   SUBJECTIVE: No acute events.   VITAL SIGNS: Temp:  [97.5 F (36.4 C)-97.9 F (36.6 C)] 97.5 F (36.4 C) (10/23 0428) Pulse Rate:  [72-96] 72 (10/23 0428) Resp:  [9-25] 22 (10/23 0428) BP: (147-220)/(48-144) 160/90 mmHg (10/23 0609) SpO2:  [86 %-97 %] 93 % (10/23 0428) Room air  INTAKE / OUTPUT: Intake/Output     10/22 0701 - 10/23 0700 10/23 0701 - 10/24 0700   P.O. 180    Total Intake(mL/kg) 180 (1.2)    Stool 1    Total Output 1     Net +179            PHYSICAL EXAMINATION: General:  MO (320 lbs) NAD at rest Neuro:  Intact HEENT:  NO LAN Cardiovascular:  HSR RRR Lungs:  CTA Abdomen:  Obese and old scars noted Musculoskeletal: Intact Skin:  warm  LABS:  CBC Recent Labs     08/21/13  2220  08/22/13  0622  WBC  8.6  9.0  HGB  15.4  14.7  HCT  45.8  44.4  PLT  244  240   Coag's Recent Labs     08/23/13  0525  APTT  39*  INR  1.13    BMET Recent Labs     08/21/13  2220  08/22/13  0622  08/24/13  0725  NA  138  137  132*  K  3.7  3.7  4.0  CL  96  97  93*  CO2  30  30  31   BUN  18  17  14   CREATININE  1.31  1.16  0.97  GLUCOSE  122*  112*  140*   Electrolytes Recent Labs   08/21/13  2220  08/22/13  0622  08/24/13  0725  CALCIUM  9.7  9.4  9.2  MG   --   1.9   --   PHOS   --   3.5   --    ABG Recent Labs     08/22/13  0050  PHART  7.411  PCO2ART  49.1*  PO2ART  58.0*   Liver Enzymes Recent Labs     08/21/13  2220  08/22/13  0622  AST  21  16  ALT  17  11  ALKPHOS  110  111  BILITOT  0.4  0.6  ALBUMIN  3.5  3.5   Cardiac Enzymes Recent Labs     08/21/13  2220  TROPONINI  <0.30  PROBNP  1325.0*   Glucose Recent Labs     08/23/13  0751  08/23/13  1356  08/23/13  1607  08/23/13  1958  08/23/13  2340  08/24/13  0757  GLUCAP  135*  123*  133*  198*  119*  124*    Imaging No results found.  ASSESSMENT / PLAN:   Tobacco Abuse RUL Mass with Satellite Lesions Mediastinal / Hilar Adenopathy LUL Pulmonary Nodule  P: -plan for FOB + EBUS in am 10/24 -NPO after MN -hold any anticoagulation for am procedure   Levy Pupa, MD, PhD 08/24/2013, 1:14 PM [facility] [phone] or if no answer [phone]

## 2013-08-25 NOTE — Transfer of Care (Signed)
Immediate Anesthesia Transfer of Care Note  Patient: Jeffrey Benitez  Procedure(s) Performed: Procedure(s): VIDEO BRONCHOSCOPY WITH ENDOBRONCHIAL ULTRASOUND (Right)  Patient Location: PACU  Anesthesia Type:General  Level of Consciousness: awake, alert , oriented and patient cooperative  Airway & Oxygen Therapy: Patient Spontanous Breathing and Patient connected to face mask oxygen  Post-op Assessment: Report given to PACU RN and Post -op Vital signs reviewed and stable  Post vital signs: Reviewed and stable  Complications: No apparent anesthesia complications

## 2013-08-25 NOTE — Anesthesia Preprocedure Evaluation (Addendum)
Anesthesia Evaluation  Patient identified by MRN, date of birth, ID band Patient awake    Reviewed: Allergy & Precautions, H&P , NPO status , Patient's Chart, lab work & pertinent test results  History of Anesthesia Complications (+) DIFFICULT AIRWAY  Airway Mallampati: II TM Distance: <3 FB Neck ROM: Limited    Dental  (+) Teeth Intact and Dental Advisory Given   Pulmonary former smoker,  08-22-13 Chest x-ray FINDINGS: Abnormal density on the right. There is a probable right upper lobe mass measuring approximately 3 cm with right peritracheal adenopathy. There is right pleural thickening. These findings were not present in 2011. No layering effusion.   Cardiac enlargement without heart failure.  Left lung clear.         Cardiovascular Exercise Tolerance: Poor hypertension, Pt. on medications and Pt. on home beta blockers +CHF Rhythm:Regular Rate:Normal  08-23-13 2 D ECHO Study Conclusions  - Left ventricle: The cavity size was normal. Wall thickness   was increased in a pattern of moderate LVH. The estimated   ejection fraction was 60%. Wall motion was normal; there   were no regional wall motion abnormalities. - Left atrium: The atrium was mildly dilated. - Right ventricle: The cavity size was normal. Systolic   function was normal.    Neuro/Psych  Headaches, Anxiety Depression    GI/Hepatic negative GI ROS, Neg liver ROS,   Endo/Other  diabetes, Type obesityGlucose 127 @ 9:46  Renal/GU Renal diseaseHx of Renal Calculi     Musculoskeletal negative musculoskeletal ROS (+)   Abdominal Normal abdominal exam  (+)   Peds  Hematology negative hematology ROS (+)   Anesthesia Other Findings Pt relays that he was a difficult intubation in the past.- Plan Video Glidescope  Reproductive/Obstetrics negative OB ROS                       Anesthesia Physical Anesthesia Plan  ASA:  II  Anesthesia Plan: General   Post-op Pain Management:    Induction: Intravenous  Airway Management Planned: Oral ETT and Video Laryngoscope Planned  Additional Equipment:   Intra-op Plan:   Post-operative Plan:   Informed Consent:   Dental advisory given  Plan Discussed with: CRNA, Anesthesiologist and Surgeon  Anesthesia Plan Comments:         Anesthesia Quick Evaluation

## 2013-08-26 ENCOUNTER — Inpatient Hospital Stay (HOSPITAL_COMMUNITY): Payer: Medicaid Other

## 2013-08-26 LAB — GLUCOSE, CAPILLARY: Glucose-Capillary: 148 mg/dL — ABNORMAL HIGH (ref 70–99)

## 2013-08-26 MED ORDER — FUROSEMIDE 40 MG PO TABS: 40.0000 mg | ORAL_TABLET | Freq: Every day | ORAL | Status: DC

## 2013-08-26 MED ORDER — OXYCODONE HCL 5 MG PO TABS: 5.0000 mg | ORAL_TABLET | ORAL | Status: DC | PRN

## 2013-08-26 MED ORDER — ONDANSETRON HCL 4 MG PO TABS: 4.0000 mg | ORAL_TABLET | Freq: Four times a day (QID) | ORAL | Status: DC | PRN

## 2013-08-26 MED ORDER — PRO-STAT SUGAR FREE PO LIQD: 30.0000 mL | Freq: Three times a day (TID) | ORAL | Status: DC

## 2013-08-26 MED ORDER — FENTANYL 25 MCG/HR TD PT72: 1.0000 | MEDICATED_PATCH | TRANSDERMAL | Status: DC

## 2013-08-26 MED ORDER — FLUOXETINE HCL 20 MG PO CAPS: 20.0000 mg | ORAL_CAPSULE | Freq: Every day | ORAL | Status: AC

## 2013-08-26 MED ORDER — GADOBENATE DIMEGLUMINE 529 MG/ML IV SOLN
20.0000 mL | Freq: Once | INTRAVENOUS | Status: AC | PRN
  Administered 2013-08-26: 09:00:00 20 mL via INTRAVENOUS

## 2013-08-26 MED ORDER — LORAZEPAM 2 MG/ML IJ SOLN: 1.0000 mg | Freq: Once | INTRAMUSCULAR | Status: DC | PRN

## 2013-08-26 MED ORDER — POTASSIUM CHLORIDE ER 10 MEQ PO TBCR: 20.0000 meq | EXTENDED_RELEASE_TABLET | Freq: Every day | ORAL | Status: DC

## 2013-08-26 MED ORDER — AMLODIPINE BESYLATE 10 MG PO TABS
10.0000 mg | ORAL_TABLET | Freq: Every day | ORAL | Status: DC
  Administered 2013-08-26: 10:00:00 10 mg via ORAL
  Filled 2013-08-26: qty 1

## 2013-08-26 MED ORDER — AMLODIPINE BESYLATE 10 MG PO TABS: 10.0000 mg | ORAL_TABLET | Freq: Every day | ORAL | Status: DC

## 2013-08-26 MED ORDER — SENNA 8.6 MG PO TABS: 2.0000 | ORAL_TABLET | Freq: Every day | ORAL | Status: AC

## 2013-08-26 NOTE — Discharge Summary (Addendum)
PATIENT DETAILS Name: Jeffrey Benitez Age: 47 y.o. Sex: male Date of Birth: 16-Mar-1966 MRN: [id number]. Admit Date: 08/21/2013 Admitting Physician: Therisa Doyne, MD WUJ:[id number],NWGN Larita Fife, MD  Recommendations for Outpatient Follow-up:  1. Please follow lung biopsy done on 10/24 2. Patient has appointment with dermatology for possible biopsy on 10/27 3. Patient has appointment with oncology on 11/3 4. Please optimize blood pressure control- medications have been adjusted this admission 5. Please optimize pain control  PRIMARY DISCHARGE DIAGNOSIS:  Principal Problem:   Mass of lung- suspected metastatic melanoma-biopsy confirmed Active Problems:   Obesity   Hypertension   Diabetes mellitus   Hypoxia   Skin lesion of back      PAST MEDICAL HISTORY: Past Medical History  Diagnosis Date  . Kidney stones   . Hypertension   . Diabetes mellitus without complication   . CHF (congestive heart failure)   . Perforated sigmoid colon   . Diverticulitis     DISCHARGE MEDICATIONS:   Medication List    STOP taking these medications       oxyCODONE-acetaminophen 10-325 MG per tablet  Commonly known as:  PERCOCET      TAKE these medications       amLODipine 10 MG tablet  Commonly known as:  NORVASC  Take 1 tablet (10 mg total) by mouth daily.     feeding supplement (PRO-STAT SUGAR FREE 64) Liqd  Take 30 mLs by mouth 3 (three) times daily with meals.     fentaNYL 25 MCG/HR patch  Commonly known as:  DURAGESIC - dosed mcg/hr  Place 1 patch (25 mcg total) onto the skin every 3 (three) days.     FLUoxetine 20 MG capsule  Commonly known as:  PROZAC  Take 1 capsule (20 mg total) by mouth daily.     furosemide 40 MG tablet  Commonly known as:  LASIX  Take 1 tablet (40 mg total) by mouth daily.     hydrALAZINE 100 MG tablet  Commonly known as:  APRESOLINE  Take 100 mg by mouth 2 (two) times daily.     lisinopril 40 MG tablet  Commonly known as:  PRINIVIL,ZESTRIL   Take 40 mg by mouth daily.     LORazepam 0.5 MG tablet  Commonly known as:  ATIVAN  Take 0.5 mg by mouth 2 (two) times daily as needed for anxiety.     metFORMIN 500 MG tablet  Commonly known as:  GLUCOPHAGE  Take 500 mg by mouth 2 (two) times daily with a meal.     metoprolol 100 MG tablet  Commonly known as:  LOPRESSOR  Take 100 mg by mouth 2 (two) times daily.     ondansetron 4 MG tablet  Commonly known as:  ZOFRAN  Take 1 tablet (4 mg total) by mouth every 6 (six) hours as needed.     oxyCODONE 5 MG immediate release tablet  Commonly known as:  Oxy IR/ROXICODONE  Take 1 tablet (5 mg total) by mouth every 4 (four) hours as needed.              senna 8.6 MG Tabs tablet  Commonly known as:  SENOKOT  Take 2 tablets (17.2 mg total) by mouth at bedtime.     testosterone cypionate 100 MG/ML injection  Commonly known as:  DEPOTESTOTERONE CYPIONATE  Inject 100 mg into the muscle every 14 (fourteen) days. For IM use only        ALLERGIES:  No Known Allergies  BRIEF HPI:  See  H&P, Labs, Consult and Test reports for all details in brief, patient is a 47 year old morbidly obese male with a history of diabetes, hypertension who presented for evaluation of the chest pain. CT of the abdomen did not show any major abnormalities, however it showed a worrisome lung nodule, this was followed by a CT of the chest which showed a large right lung mass with numerous other smaller masses. Patient was then admitted for further evaluation and treatment.  CONSULTATIONS:   pulmonary/intensive care  PERTINENT RADIOLOGIC STUDIES: Ct Abdomen Pelvis Wo Contrast  08/21/2013   CLINICAL DATA:  Nephrolithiasis. Left flank pain  EXAM: CT ABDOMEN AND PELVIS WITHOUT CONTRAST  TECHNIQUE: Multidetector CT imaging of the abdomen and pelvis was performed following the standard protocol without intravenous contrast.  COMPARISON:  CT 07/08/2010  FINDINGS: Nonobstructing small renal calculi on the left. 2 mm  stones are present left upper, mid, and lower pole. No ureteral calculi. No right renal calculi. 2 cm right renal cyst.  1 cm hypodensity right lobe of the liver posteriorly is indeterminate but probably benign. This is not seen on the prior study however there was significant streak artifact through the liver. Pancreas and spleen are normal.  Negative for bowel obstruction. Appendix is normal. No free fluid, mass, or adenopathy.  2 cm pleural based density right lateral lung base, not definitely confirmed on prior studies. This has soft tissue density. CT chest is recommended for further evaluation.  IMPRESSION: 3 nonobstructive stones left kidney.  No ureteral calculi.  2 cm pleural based density right lateral lung base. Recommend chest CT for further evaluation.   Electronically Signed   By: Marlan Palau M.D.   On: 08/21/2013 23:23   Dg Chest 2 View  08/22/2013   CLINICAL DATA:  Short of breath, hypoxemia  EXAM: CHEST  2 VIEW  COMPARISON:  09/12/2010. CT abdomen today 8  FINDINGS: Abnormal density on the right. There is a probable right upper lobe mass measuring approximately 3 cm with right peritracheal adenopathy. There is right pleural thickening. These findings were not present in 2011. No layering effusion.  Cardiac enlargement without heart failure.  Left lung clear.  IMPRESSION: Right lung density with right peritracheal adenopathy. Findings are worrisome for carcinoma of the lung. CT chest with contrast recommended for further evaluation.   Electronically Signed   By: Marlan Palau M.D.   On: 08/22/2013 00:35   Ct Chest W Contrast  08/22/2013   CLINICAL DATA:  Shortness of breath. Chest mass on x-ray.  EXAM: CT CHEST WITH CONTRAST  TECHNIQUE: Multidetector CT imaging of the chest was performed during intravenous contrast administration.  CONTRAST:  OMNIPAQUE IOHEXOL 300 MG/ML  SOLN  COMPARISON:  Chest radiograph 08/22/2013  FINDINGS: Multiple bilateral pulmonary masses. The largest mass  is in the right upper lung anteriorly, measuring 5.2 x 5.4 cm. Multiple additional right lung masses are present, measuring up to 4.7 x 2.9 cm diameter. A single mass is demonstrated in the left upper lung posteriorly, measuring 1.5 cm in diameter. The findings are consistent with primary and/or metastatic disease.  Right hilar lymphadenopathy with lymph nodes measuring up to about 3.8 x 2.5 cm. Subcarinal lymphadenopathy and right pretracheal lymphadenopathy is also present.  Normal heart size. Normal caliber thoracic aorta. Coronary artery calcification. The esophagus is decompressed. No pleural effusions. No pneumothorax.  Visualized portions of the upper abdominal organs demonstrate mild fatty infiltration of the liver.  Degenerative changes in the thoracic spine. No destructive bone lesions  are appreciated.  IMPRESSION: Right upper lung mass measuring up to 5.4 cm diameter with multiple additional right lung mass is and a single left lung nodule. Lymphadenopathy in the right hilum and mediastinum. Changes are consistent with primary and/ or metastatic disease.   Electronically Signed   By: Burman Nieves M.D.   On: 08/22/2013 02:19   Mr Laqueta Jean ZO Contrast  08/26/2013   CLINICAL DATA:  New found lung mass is.  Lung cancer.  EXAM: MRI HEAD WITHOUT AND WITH CONTRAST  TECHNIQUE: Multiplanar, multiecho pulse sequences of the brain and surrounding structures were obtained according to standard protocol without and with intravenous contrast  CONTRAST:  20mL MULTIHANCE GADOBENATE DIMEGLUMINE 529 MG/ML IV SOLN  COMPARISON:  None.  FINDINGS: The study is mildly degraded by patient motion. Precontrast examination was done on the evening of 08/25/2013. The patient return for postcontrast imaging on the morning of 08/26/2013. No acute cortical infarct, hemorrhage, or mass lesion is present. Mild generalized atrophy and white matter disease is present, advanced for age. The ventricles are proportionate to the degree  of atrophy. No significant extra-axial fluid collections are present. Flow is present in the major intracranial arteries. The globes and orbits are intact.  The postcontrast images are mildly degraded by patient motion. No enhancing lesions are evident. Note is made of diffuse marrow signal loss in the upper cervical spine and calvarium. No discrete lesions are evident.  IMPRESSION: 1. No evidence for metastatic disease of the brain or meninges. 2. Mild generalized atrophy and white matter disease. This is somewhat advanced for age. This likely reflects the sequela of chronic microvascular ischemia in a patient with multiple risk factors.   Electronically Signed   By: Gennette Pac M.D.   On: 08/26/2013 09:45   Mr Lumbar Spine W Wo Contrast  08/26/2013   CLINICAL DATA:  Lung lesions. Severe back pain.  EXAM: MRI LUMBAR SPINE WITHOUT AND WITH CONTRAST  TECHNIQUE: Multiplanar and multiecho pulse sequences of the lumbar spine were obtained without and with intravenous contrast.  CONTRAST:  20mL MULTIHANCE GADOBENATE DIMEGLUMINE 529 MG/ML IV SOLN  COMPARISON:  CT abdomen and pelvis without contrast 08/21/2013.  FINDINGS: Normal signal is present in the conus medullaris which terminates at L1. There is diffuse loss of normal T1 marrow signal with extensive heterogeneous enhancement compatible with diffuse osseous metastases. Enhancement and pathologic fractures are evident along the superior endplate of L1 and the superior and inferior endplates of L2. There is no retropulsion of bone. There is a slight pathologic fracture along the superior endplate of L3. Extensive enhancement is noted in the posterior elements of T11, L3, and L4. AP alignment is maintained. Diffuse metastatic lesions are noted within the sacrum and visualized iliac bones. Limited imaging of the abdomen is unremarkable.  The disc levels above L1-2 are normal.  There is focal epidural enhancement posterior to the L2 vertebral body, representing  either extraosseous tumor or retropulsed bone. This encroaches on the inferior aspect of the left lateral recess at L1-2.  L2-3: Mild disk bulging and facet hypertrophy are present without significant stenosis.  L3-4: A mild broad-based disc bulge is present. Short pedicles and facet hypertrophy are noted. There is no significant focal stenosis.  L4-5: A broad-based disc herniation and mild facet hypertrophy is evident bilaterally. There is some distortion of the central canal without significant stenosis. Mild foraminal narrowing is present, left greater than right.  L5-S1: Mild facet hypertrophy and disc bulging is present bilaterally. There is  no significant stenosis.  IMPRESSION: 1. Diffuse osseous metastases with pathologic compression fractures at L1, L2, and to a lesser extent at L3. 2. Extraosseous extension of tumor versus slightly retropulsed bone at L2 on the left resulting in mild narrowing along the inferior lateral recess of L1-2, potentially affecting the left L2 nerve roots. 3. Mild broad-based disk bulging at L2-3 and L3-4 without significant stenosis. 4. Disk bulging and facet hypertrophy results in mild lateral recess narrowing at L4-5 greater than right. 5. Facet hypertrophy and minimal bulging at L5-S1 without significant stenosis.   Electronically Signed   By: Gennette Pac M.D.   On: 08/26/2013 10:04     PERTINENT LAB RESULTS: CBC: No results found for this basename: WBC, HGB, HCT, PLT,  in the last 72 hours CMET CMP     Component Value Date/Time   NA 135 08/25/2013 0428   K 3.8 08/25/2013 0428   CL 96 08/25/2013 0428   CO2 27 08/25/2013 0428   GLUCOSE 128* 08/25/2013 0428   BUN 13 08/25/2013 0428   CREATININE 0.88 08/25/2013 0428   CALCIUM 9.2 08/25/2013 0428   PROT 7.2 08/22/2013 0622   ALBUMIN 3.5 08/22/2013 0622   AST 16 08/22/2013 0622   ALT 11 08/22/2013 0622   ALKPHOS 111 08/22/2013 0622   BILITOT 0.6 08/22/2013 0622   GFRNONAA >90 08/25/2013 0428   GFRAA >90  08/25/2013 0428    GFR Estimated Creatinine Clearance: 144.9 ml/min (by C-G formula based on Cr of 0.88). No results found for this basename: LIPASE, AMYLASE,  in the last 72 hours No results found for this basename: CKTOTAL, CKMB, CKMBINDEX, TROPONINI,  in the last 72 hours No components found with this basename: POCBNP,  No results found for this basename: DDIMER,  in the last 72 hours No results found for this basename: HGBA1C,  in the last 72 hours No results found for this basename: CHOL, HDL, LDLCALC, TRIG, CHOLHDL, LDLDIRECT,  in the last 72 hours No results found for this basename: TSH, T4TOTAL, FREET3, T3FREE, THYROIDAB,  in the last 72 hours No results found for this basename: VITAMINB12, FOLATE, FERRITIN, TIBC, IRON, RETICCTPCT,  in the last 72 hours Coags: No results found for this basename: PT, INR,  in the last 72 hours Microbiology: Recent Results (from the past 240 hour(s))  SURGICAL PCR SCREEN     Status: None   Collection Time    08/24/13  3:48 PM      Result Value Range Status   MRSA, PCR NEGATIVE  NEGATIVE Final   Staphylococcus aureus NEGATIVE  NEGATIVE Final   Comment:            The Xpert SA Assay (FDA     approved for NASAL specimens     in patients over 15 years of age),     is one component of     a comprehensive surveillance     program.  Test performance has     been validated by [employer] for patients greater     than or equal to 52 year old.     It is not intended     to diagnose infection nor to     guide or monitor treatment.     BRIEF HOSPITAL COURSE:  Lung mass - Please see CT scan chest results for further details, however given a right large Lung mass with numerous small other masses, obviously there was concern for malignancy. Both interventional radiology and  PCCM consulted, it was felt that patient would require a flexible bronchoscopy with transbronchial biopsy, this was attempted on 10/22, however it was unsuccessful. Patient was  then scheduled for bronchoscopy under general anesthesia on 10/24, multiple transbronchial biopsies were obtained, the results of which are currently pending at the time of discharge. At this time it is not known whether the lung mass is a primary malignancy or metastatic disease. Further plan and workup including treatment with need to be done in the outpatient setting. Appointment with oncology-Dr. Federico Flake been scheduled for November 3 at 2:15 PM. -Since the patient complained of persistent back pain and headache, MRI of the lumbar spine and MRI of the brain was also obtained. MRI of the brain was negative for any metastatic disease, however MRI of the lumbar spine showed diffuse osseous metastases with pathologic compression fractures at L1, L2 and to a lesser extent at L3. On physical exam, he has 5/5 strength at all of his 4 extremities. Since there is no cord compromise, it is felt that the patient would require results of that biopsy, and oncology evaluation to determine further management. At this time, patient is being discharged in a stable condition, he has an outpatient appointment with oncology on November 3. - Long discussions were held with the patient, and also with the patient's mother today, importance of keeping the appointments as noted below has been discussed with the patient. Patient also has been told he must return to the emergency room, if he has persistently severe worsening back pain, developed lower extremity weakness or urinary incontinence. - Both the patient, and the mother are accepting of the above-noted plan.  Addendum - Biopsy of hilar mass confirms malignant melanoma  Back pain - Most in the lumbar spine area, MRI results as above. - On admission, patient initially required IV narcotics, however he is now being transitioned to transdermal fentanyl and as needed oxycodone. - Please see above for further details  Skin lesion -possible melanoma  -dermatology appt  set for Mon 10/27 w/ Dr. Terri Piedra  Hypertension - BP very labile and fluctuating. Mostly uncontrolled. - We have added Lasix 40 mg and amlodipine during this admission stay. He will continue with lisinopril, metoprolol and hydralazine as well. - Further optimization will be done in the outpatient setting by his primary care practitioner.  Diabetes - On admission, metformin was placed on hold, he was then managed with sliding scale insulin. CBGs were relatively stable during this admission. He will be resumed on metformin on discharge.  Obesity-Morbid  -counseled regarding importance of weight loss  TODAY-DAY OF DISCHARGE:  Subjective:   Lennon Boutwell today has no headache,no chest abdominal pain,no new weakness tingling or numbness, feels much better wants to go home today.   Objective:   Blood pressure 160/80, pulse 80, temperature 98.5 F (36.9 C), temperature source Oral, resp. rate 20, height 5' 8.5" (1.74 m), weight 145.151 kg (320 lb), SpO2 94.00%.  Intake/Output Summary (Last 24 hours) at 08/26/13 1230 Last data filed at 08/25/13 1859  Gross per 24 hour  Intake    360 ml  Output      0 ml  Net    360 ml   Filed Weights   08/21/13 1916 08/22/13 0551  Weight: 145.151 kg (320 lb) 145.151 kg (320 lb)    Exam Awake Alert, Oriented *3, No new F.N deficits, Normal affect Central.AT,PERRAL Supple Neck,No JVD, No cervical lymphadenopathy appriciated.  Symmetrical Chest wall movement, Good air movement bilaterally, CTAB RRR,No  Gallops,Rubs or new Murmurs, No Parasternal Heave +ve B.Sounds, Abd Soft, Non tender, No organomegaly appriciated, No rebound -guarding or rigidity. No Cyanosis, Clubbing or edema, No new Rash or bruise  DISCHARGE CONDITION: Stable  DISPOSITION: Home  DISCHARGE INSTRUCTIONS:    Activity:  As tolerated   Diet recommendation: Diabetic Diet Heart Healthy diet   Discharge Orders   Future Appointments Provider Department Dept Phone   09/04/2013  1:30 PM [facility] [phone]   09/04/2013 1:45 PM Delcie Roch [facility] [phone]   09/04/2013 2:15 PM Si Gaul, MD [facility] [phone]   Future Orders Complete By Expires   Call MD for:  severe uncontrolled pain  As directed    Call MD for:  As directed    Scheduling Instructions:     Return to the ED ASAP if you ever develop lower ext weakness, urinary incontinence, fecal incontinence or severe worsening of your back pain   Diet - low sodium heart healthy  As directed    Diet general  As directed    Increase activity slowly  As directed       Follow-up Information   Follow up with LUPTON III, Mart Piggs, MD On 08/28/2013. ( appt at 2 pm)    Specialty:  Dermatology   Contact information:   [address] [place] [address] [phone]       Follow up with Lajuana Matte., MD On 09/04/2013. (appointment at 2:15 pm)    Specialty:  Oncology   Contact information:   [address] [place] [address] [phone]       Follow up with Neldon Labella, MD On 08/30/2013. (please keep this pre-existing appt)    Specialty:  Family Medicine   Contact information:   [address] [place] [address] [phone]         Total Time spent on discharge equals 45 minutes.  SignedJeoffrey Massed 08/26/2013 12:30 PM

## 2013-08-26 NOTE — Progress Notes (Signed)
[name] to be D/C'd Home per MD order.  Discussed with the patient and all questions fully answered.    Medication List    STOP taking these medications       oxyCODONE-acetaminophen 10-325 MG per tablet  Commonly known as:  PERCOCET      TAKE these medications       amLODipine 10 MG tablet  Commonly known as:  NORVASC  Take 1 tablet (10 mg total) by mouth daily.     feeding supplement (PRO-STAT SUGAR FREE 64) Liqd  Take 30 mLs by mouth 3 (three) times daily with meals.     fentaNYL 25 MCG/HR patch  Commonly known as:  DURAGESIC - dosed mcg/hr  Place 1 patch (25 mcg total) onto the skin every 3 (three) days.     FLUoxetine 20 MG capsule  Commonly known as:  PROZAC  Take 1 capsule (20 mg total) by mouth daily.     furosemide 40 MG tablet  Commonly known as:  LASIX  Take 1 tablet (40 mg total) by mouth daily.     hydrALAZINE 100 MG tablet  Commonly known as:  APRESOLINE  Take 100 mg by mouth 2 (two) times daily.     lisinopril 40 MG tablet  Commonly known as:  PRINIVIL,ZESTRIL  Take 40 mg by mouth daily.     LORazepam 0.5 MG tablet  Commonly known as:  ATIVAN  Take 0.5 mg by mouth 2 (two) times daily as needed for anxiety.     metFORMIN 500 MG tablet  Commonly known as:  GLUCOPHAGE  Take 500 mg by mouth 2 (two) times daily with a meal.     metoprolol 100 MG tablet  Commonly known as:  LOPRESSOR  Take 100 mg by mouth 2 (two) times daily.     ondansetron 4 MG tablet  Commonly known as:  ZOFRAN  Take 1 tablet (4 mg total) by mouth every 6 (six) hours as needed.     oxyCODONE 5 MG immediate release tablet  Commonly known as:  Oxy IR/ROXICODONE  Take 1 tablet (5 mg total) by mouth every 4 (four) hours as needed.     potassium chloride 10 MEQ tablet  Commonly known as:  K-DUR  Take 2 tablets (20 mEq total) by mouth daily.     senna 8.6 MG Tabs tablet  Commonly known as:  SENOKOT  Take 2 tablets (17.2 mg total) by mouth at bedtime.     testosterone  cypionate 100 MG/ML injection  Commonly known as:  DEPOTESTOTERONE CYPIONATE  Inject 100 mg into the muscle every 14 (fourteen) days. For IM use only        VVS, Skin clean, dry and intact without evidence of skin break down, no evidence of skin tears noted. IV catheter discontinued intact. Site without signs and symptoms of complications. Dressing and pressure applied.  An After Visit Summary was printed and given to the patient. Follow up appointments , new prescriptions and medication administration times given. All questions anwered Patient escorted via WC, and D/C home via private auto.  Cindra Eves, RN 08/26/2013 2:58 PM

## 2013-08-28 ENCOUNTER — Encounter (HOSPITAL_COMMUNITY): Payer: Self-pay | Admitting: Emergency Medicine

## 2013-08-28 ENCOUNTER — Other Ambulatory Visit: Payer: Self-pay | Admitting: Dermatology

## 2013-08-28 DIAGNOSIS — C4359 Malignant melanoma of other part of trunk: Secondary | ICD-10-CM

## 2013-08-28 HISTORY — DX: Malignant melanoma of other part of trunk: C43.59

## 2013-08-28 HISTORY — PX: OTHER SURGICAL HISTORY: SHX169

## 2013-08-28 NOTE — Anesthesia Postprocedure Evaluation (Signed)
  Anesthesia Post-op Note  Patient: Jeffrey Benitez  Procedure(s) Performed: Procedure(s): VIDEO BRONCHOSCOPY WITH ENDOBRONCHIAL ULTRASOUND (Right)  Patient Location: PACU  Anesthesia Type:General  Level of Consciousness: awake, alert  and oriented  Airway and Oxygen Therapy: Patient Spontanous Breathing and Patient connected to nasal cannula oxygen  Post-op Pain: none  Post-op Assessment: Post-op Vital signs reviewed, Patient's Cardiovascular Status Stable, Respiratory Function Stable, Patent Airway and Pain level controlled  Post-op Vital Signs: stable  Complications: No apparent anesthesia complications

## 2013-08-30 ENCOUNTER — Telehealth: Payer: Self-pay | Admitting: Emergency Medicine

## 2013-08-30 NOTE — Telephone Encounter (Signed)
lmtcb x1 for pt--will send message over to RB regarding results.

## 2013-08-30 NOTE — Telephone Encounter (Signed)
Reviewed bx results with pt by phone. He has an OV with Dr Arbutus Ped on 09/04/13. Will confirm with Dr Judie Petit that he wants to see him vs refer to someone else for melanoma.

## 2013-08-30 NOTE — Telephone Encounter (Signed)
I spoke with Jeffrey Benitez and is aware we will ask RB regarding results and let him know when Dr. Delton Coombes makes [place] aware. Please advise thanks

## 2013-08-31 NOTE — Telephone Encounter (Signed)
Please let pt know that Dr Delton Coombes reviewed case with Dr Arbutus Ped and he should keep his appointment with Dr Arbutus Ped as planned. Thanks

## 2013-08-31 NOTE — Telephone Encounter (Signed)
Spoke with pt and advised that Dr Delton Coombes has reviewed case with Dr Arbutus Ped and he needs to keep appt has planned

## 2013-08-31 NOTE — Telephone Encounter (Signed)
LMOMTCB x 1 

## 2013-09-04 ENCOUNTER — Ambulatory Visit (HOSPITAL_BASED_OUTPATIENT_CLINIC_OR_DEPARTMENT_OTHER): Payer: Medicaid Other | Admitting: Internal Medicine

## 2013-09-04 ENCOUNTER — Other Ambulatory Visit: Payer: Self-pay | Admitting: *Deleted

## 2013-09-04 ENCOUNTER — Encounter: Payer: Self-pay | Admitting: Internal Medicine

## 2013-09-04 ENCOUNTER — Telehealth: Payer: Self-pay | Admitting: Internal Medicine

## 2013-09-04 ENCOUNTER — Other Ambulatory Visit (HOSPITAL_BASED_OUTPATIENT_CLINIC_OR_DEPARTMENT_OTHER): Payer: Medicaid Other | Admitting: Lab

## 2013-09-04 ENCOUNTER — Ambulatory Visit: Payer: Medicaid Other

## 2013-09-04 DIAGNOSIS — C7951 Secondary malignant neoplasm of bone: Secondary | ICD-10-CM

## 2013-09-04 DIAGNOSIS — C4359 Malignant melanoma of other part of trunk: Secondary | ICD-10-CM

## 2013-09-04 DIAGNOSIS — E119 Type 2 diabetes mellitus without complications: Secondary | ICD-10-CM

## 2013-09-04 DIAGNOSIS — I10 Essential (primary) hypertension: Secondary | ICD-10-CM

## 2013-09-04 DIAGNOSIS — C7800 Secondary malignant neoplasm of unspecified lung: Secondary | ICD-10-CM

## 2013-09-04 DIAGNOSIS — R918 Other nonspecific abnormal finding of lung field: Secondary | ICD-10-CM

## 2013-09-04 LAB — COMPREHENSIVE METABOLIC PANEL (CC13)
ALT: 10 U/L (ref 0–55)
AST: 20 U/L (ref 5–34)
Albumin: 2.9 g/dL — ABNORMAL LOW (ref 3.5–5.0)
Anion Gap: 10 mEq/L (ref 3–11)
BUN: 15.6 mg/dL (ref 7.0–26.0)
CO2: 29 mEq/L (ref 22–29)
Calcium: 9.8 mg/dL (ref 8.4–10.4)
Chloride: 98 mEq/L (ref 98–109)
Glucose: 118 mg/dl (ref 70–140)
Potassium: 4.3 mEq/L (ref 3.5–5.1)
Total Bilirubin: 0.51 mg/dL (ref 0.20–1.20)
Total Protein: 7.3 g/dL (ref 6.4–8.3)

## 2013-09-04 LAB — CBC WITH DIFFERENTIAL/PLATELET
BASO%: 1.3 % (ref 0.0–2.0)
Basophils Absolute: 0.1 10*3/uL (ref 0.0–0.1)
EOS%: 1.6 % (ref 0.0–7.0)
HCT: 41.4 % (ref 38.4–49.9)
HGB: 13.1 g/dL (ref 13.0–17.1)
MCH: 26 pg — ABNORMAL LOW (ref 27.2–33.4)
MCHC: 31.6 g/dL — ABNORMAL LOW (ref 32.0–36.0)
MONO#: 0.6 10*3/uL (ref 0.1–0.9)
NEUT#: 5.8 10*3/uL (ref 1.5–6.5)
NEUT%: 70.3 % (ref 39.0–75.0)
RDW: 15.4 % — ABNORMAL HIGH (ref 11.0–14.6)
WBC: 8.3 10*3/uL (ref 4.0–10.3)
lymph#: 1.7 10*3/uL (ref 0.9–3.3)

## 2013-09-04 LAB — TECHNOLOGIST REVIEW

## 2013-09-04 MED ORDER — OXYCODONE HCL 5 MG PO TABS: 5.0000 mg | ORAL_TABLET | ORAL | Status: DC | PRN

## 2013-09-04 MED ORDER — ONDANSETRON HCL 4 MG PO TABS: 8.0000 mg | ORAL_TABLET | Freq: Three times a day (TID) | ORAL | Status: AC | PRN

## 2013-09-04 MED ORDER — FENTANYL 50 MCG/HR TD PT72: 1.0000 | MEDICATED_PATCH | TRANSDERMAL | Status: DC

## 2013-09-04 NOTE — Telephone Encounter (Signed)
, °

## 2013-09-04 NOTE — Patient Instructions (Signed)
You are recently diagnosed with metastatic melanoma. We discussed treatment options and I will refer you to [facility] for consideration of treatment with IL-2. Followup visit in 2 weeks.

## 2013-09-04 NOTE — Progress Notes (Signed)
[facility] [phone]   [phone]  CONSULT NOTE  REFERRING PHYSICIAN: Dr. Levy Pupa  REASON FOR CONSULTATION:  47 years old white male recently diagnosed with metastatic melanoma  HPI Jeffrey Benitez is a 47 y.o. male his past medical history significant for hypertension, diabetes mellitus, status post colon resection secondary to perforated sigmoid colon and 2012 secondary to diverticulosis, history of sleep apnea and kidney stone. The patient was seen at [facility] on 08/21/2013 complaining of back pain of 3 weeks duration. It was felt initially to be secondary to his history of kidney stone and the patient had CT of the abdomen performed on 08/21/2013. It showed this to be nonobstructive stones in the left kidney but there was 2 CM a pleural-based density in the right lateral lung base. This was followed by CT scan of the chest with contrast on 08/22/2013 and it showed multiple bilateral pulmonary masses. The largest mass is in the right upper lung anteriorly, measuring 5.2 x 5.4 cm. Multiple additional right lung masses are present, measuring up to 4.7 x 2.9 cm diameter. A single mass is demonstrated in the left upper lung posteriorly, measuring 1.5 cm in diameter. The findings are consistent with primary and/or metastatic disease. Right hilar lymphadenopathy with lymph nodes measuring up to about 3.8 x 2.5 cm. Subcarinal lymphadenopathy and right pretracheal  lymphadenopathy is also present. On 08/25/2013 the patient underwent video bronchoscopy with endobronchial ultrasound under the care of Dr. Delton Coombes. The final pathology (Accession: [id number]) the right hilar mass showed malignant cells consistent with malignant melanoma. BRAF result is still pending. MRI of the brain on 08/26/2013 showed no evidence for metastatic disease to the brain. MRI of the lumbar spine on 08/26/2013 showed Diffuse osseous metastases with pathologic compression  fractures at L1, L2, and to a lesser extent at L3. Extraosseous extension of tumor versus slightly retropulsed bone at L2 on the left resulting in mild narrowing along the inferior lateral recess of L1-2, potentially affecting the left L2 nerve roots. Mild broad-based disk bulging at L2-3 and L3-4 without  significant stenosis. Disk bulging and facet hypertrophy results in mild lateral recess narrowing at L4-5 greater than right. Facet hypertrophy and minimal bulging at L5-S1 without significant stenosis. Dr. Delton Coombes kindly referred the patient to me today for further evaluation and recommendation regarding treatment of his recently diagnosed metastatic melanoma. When seen today, the patient continues to complain of the low back pain and he is currently on treatment with fentanyl patch 25 mcg/hour every 3 days in addition to oxycodone 5 mg by mouth every 4 hours as needed for breakthrough pain and he takes it more frequently than every 4 hours. The patient also has mild nausea secondary to his multiple medications in addition to shortness breath with exertion but he denied having any significant cough or hemoptysis. He has no visual changes but has occasional headache. He mentioned a history of a subcutaneous nodule in the lower back that has been there for years and he did not give any attention to it. It was recently biopsied by Dr. Terri Piedra but the final pathology is still pending. His family history is significant for a mother with diabetes mellitus and osteoarthritis and father with hypertension, hypercholesterolemia and sleep apnea. The patient is single and has no children. He was accompanied by his mother Jeffrey Benitez. He is currently unemployed and filed for disability. He has remote history of smoking but quit 16 years ago. He drinks alcohol on  the weekends and sometimes he gets drunk. He has no history of drug abuse.  HPI  Past Medical History  Diagnosis Date  . Kidney stones   . Hypertension   .  Diabetes mellitus without complication   . CHF (congestive heart failure)   . Perforated sigmoid colon   . Diverticulitis     Past Surgical History  Procedure Laterality Date  . Kidney stone surgery    . Colon surgery    . Colon ressection    . Video bronchoscopy Bilateral 08/23/2013    Procedure: VIDEO BRONCHOSCOPY WITH FLUORO;  Surgeon: Leslye Peer, MD;  Location: [facility] ENDOSCOPY;  Service: Cardiopulmonary;  Laterality: Bilateral;  . Video bronchoscopy with endobronchial ultrasound Right 08/25/2013    Procedure: VIDEO BRONCHOSCOPY WITH ENDOBRONCHIAL ULTRASOUND;  Surgeon: Leslye Peer, MD;  Location: [facility] OR;  Service: Thoracic;  Laterality: Right;    Family History  Problem Relation Age of Onset  . Diabetes type II Mother   . Hypertension Father   . Diabetes type II Father   . Heart disease Father     Social History History  Substance Use Topics  . Smoking status: Former Games developer  . Smokeless tobacco: Former Neurosurgeon    Quit date: 08/22/1999  . Alcohol Use: Yes     Comment: Socially    No Known Allergies  Current Outpatient Prescriptions  Medication Sig Dispense Refill  . furosemide (LASIX) 20 MG tablet Take 20 mg by mouth.      . hydrALAZINE (APRESOLINE) 100 MG tablet Take 100 mg by mouth 2 (two) times daily.      Marland Kitchen lisinopril (PRINIVIL,ZESTRIL) 40 MG tablet Take 40 mg by mouth daily.      . metFORMIN (GLUCOPHAGE) 500 MG tablet Take 500 mg by mouth 2 (two) times daily with a meal.      . metoprolol (LOPRESSOR) 100 MG tablet Take 100 mg by mouth 2 (two) times daily.      Marland Kitchen oxyCODONE (OXY IR/ROXICODONE) 5 MG immediate release tablet Take 1 tablet (5 mg total) by mouth every 4 (four) hours as needed.  40 tablet  0  . potassium chloride (K-DUR) 10 MEQ tablet Take 2 tablets (20 mEq total) by mouth daily.  30 tablet  0  . senna (SENOKOT) 8.6 MG TABS tablet Take 2 tablets (17.2 mg total) by mouth at bedtime.  120 each  0  . testosterone cypionate (DEPOTESTOTERONE CYPIONATE) 100  MG/ML injection Inject 100 mg into the muscle every 14 (fourteen) days. For IM use only      . amLODipine (NORVASC) 10 MG tablet Take 1 tablet (10 mg total) by mouth daily.  30 tablet  0  . fentaNYL (DURAGESIC) 50 MCG/HR Place 1 patch (50 mcg total) onto the skin every 3 (three) days.  10 patch  0  . FLUoxetine (PROZAC) 20 MG capsule Take 1 capsule (20 mg total) by mouth daily.    3  . furosemide (LASIX) 40 MG tablet Take 20 mg by mouth daily.      Marland Kitchen LORazepam (ATIVAN) 0.5 MG tablet Take 0.5 mg by mouth 2 (two) times daily as needed for anxiety.      . ondansetron (ZOFRAN) 4 MG tablet Take 2 tablets (8 mg total) by mouth every 8 (eight) hours as needed.  60 tablet  0   No current facility-administered medications for this visit.    Review of Systems  Constitutional: negative Eyes: negative Ears, nose, mouth, throat, and face: negative Respiratory: positive for  dyspnea on exertion Cardiovascular: negative Gastrointestinal: negative Genitourinary:negative Integument/breast: negative Hematologic/lymphatic: negative Musculoskeletal:positive for back pain and bone pain Neurological: negative Behavioral/Psych: negative Endocrine: negative Allergic/Immunologic: negative  Physical Exam  WUJ:WJXBJ, healthy, no distress, well nourished and well developed SKIN: skin color, texture, turgor are normal, no rashes or significant lesions HEAD: Normocephalic, No masses, lesions, tenderness or abnormalities EYES: normal, PERRLA EARS: External ears normal, Canals clear OROPHARYNX:no exudate, no erythema and lips, buccal mucosa, and tongue normal  NECK: supple, no adenopathy, no JVD LYMPH:  no palpable lymphadenopathy, no hepatosplenomegaly LUNGS: and palpation, clear to auscultation and percussion HEART: regular rate & rhythm and no murmurs ABDOMEN:abdomen soft, non-tender, obese, normal bowel sounds and no masses or organomegaly BACK: Back symmetric, no curvature., No CVA  tenderness EXTREMITIES:no joint deformities, effusion, or inflammation, no edema, no skin discoloration  NEURO: alert & oriented x 3 with fluent speech, no focal motor/sensory deficits  PERFORMANCE STATUS: ECOG 1  LABORATORY DATA: Lab Results  Component Value Date   WBC 8.3 09/04/2013   HGB 13.1 09/04/2013   HCT 41.4 09/04/2013   MCV 82.4 09/04/2013   PLT 251 09/04/2013      Chemistry      Component Value Date/Time   NA 137 09/04/2013 1348   NA 135 08/25/2013 0428   K 4.3 09/04/2013 1348   K 3.8 08/25/2013 0428   CL 96 08/25/2013 0428   CO2 29 09/04/2013 1348   CO2 27 08/25/2013 0428   BUN 15.6 09/04/2013 1348   BUN 13 08/25/2013 0428   CREATININE 0.9 09/04/2013 1348   CREATININE 0.88 08/25/2013 0428      Component Value Date/Time   CALCIUM 9.8 09/04/2013 1348   CALCIUM 9.2 08/25/2013 0428   ALKPHOS 146 09/04/2013 1348   ALKPHOS 111 08/22/2013 0622   AST 20 09/04/2013 1348   AST 16 08/22/2013 0622   ALT 10 09/04/2013 1348   ALT 11 08/22/2013 0622   BILITOT 0.51 09/04/2013 1348   BILITOT 0.6 08/22/2013 0622       RADIOGRAPHIC STUDIES: Ct Abdomen Pelvis Wo Contrast  08/21/2013   CLINICAL DATA:  Nephrolithiasis. Left flank pain  EXAM: CT ABDOMEN AND PELVIS WITHOUT CONTRAST  TECHNIQUE: Multidetector CT imaging of the abdomen and pelvis was performed following the standard protocol without intravenous contrast.  COMPARISON:  CT 07/08/2010  FINDINGS: Nonobstructing small renal calculi on the left. 2 mm stones are present left upper, mid, and lower pole. No ureteral calculi. No right renal calculi. 2 cm right renal cyst.  1 cm hypodensity right lobe of the liver posteriorly is indeterminate but probably benign. This is not seen on the prior study however there was significant streak artifact through the liver. Pancreas and spleen are normal.  Negative for bowel obstruction. Appendix is normal. No free fluid, mass, or adenopathy.  2 cm pleural based density right lateral lung base, not  definitely confirmed on prior studies. This has soft tissue density. CT chest is recommended for further evaluation.  IMPRESSION: 3 nonobstructive stones left kidney.  No ureteral calculi.  2 cm pleural based density right lateral lung base. Recommend chest CT for further evaluation.   Electronically Signed   By: Marlan Palau M.D.   On: 08/21/2013 23:23   Dg Chest 2 View  08/22/2013   CLINICAL DATA:  Short of breath, hypoxemia  EXAM: CHEST  2 VIEW  COMPARISON:  09/12/2010. CT abdomen today 8  FINDINGS: Abnormal density on the right. There is a probable right upper lobe mass  measuring approximately 3 cm with right peritracheal adenopathy. There is right pleural thickening. These findings were not present in 2011. No layering effusion.  Cardiac enlargement without heart failure.  Left lung clear.  IMPRESSION: Right lung density with right peritracheal adenopathy. Findings are worrisome for carcinoma of the lung. CT chest with contrast recommended for further evaluation.   Electronically Signed   By: Marlan Palau M.D.   On: 08/22/2013 00:35   Ct Chest W Contrast  08/22/2013   CLINICAL DATA:  Shortness of breath. Chest mass on x-ray.  EXAM: CT CHEST WITH CONTRAST  TECHNIQUE: Multidetector CT imaging of the chest was performed during intravenous contrast administration.  CONTRAST:  OMNIPAQUE IOHEXOL 300 MG/ML  SOLN  COMPARISON:  Chest radiograph 08/22/2013  FINDINGS: Multiple bilateral pulmonary masses. The largest mass is in the right upper lung anteriorly, measuring 5.2 x 5.4 cm. Multiple additional right lung masses are present, measuring up to 4.7 x 2.9 cm diameter. A single mass is demonstrated in the left upper lung posteriorly, measuring 1.5 cm in diameter. The findings are consistent with primary and/or metastatic disease.  Right hilar lymphadenopathy with lymph nodes measuring up to about 3.8 x 2.5 cm. Subcarinal lymphadenopathy and right pretracheal lymphadenopathy is also present.  Normal  heart size. Normal caliber thoracic aorta. Coronary artery calcification. The esophagus is decompressed. No pleural effusions. No pneumothorax.  Visualized portions of the upper abdominal organs demonstrate mild fatty infiltration of the liver.  Degenerative changes in the thoracic spine. No destructive bone lesions are appreciated.  IMPRESSION: Right upper lung mass measuring up to 5.4 cm diameter with multiple additional right lung mass is and a single left lung nodule. Lymphadenopathy in the right hilum and mediastinum. Changes are consistent with primary and/ or metastatic disease.   Electronically Signed   By: Burman Nieves M.D.   On: 08/22/2013 02:19   Mr Laqueta Jean HQ Contrast  08/26/2013   CLINICAL DATA:  New found lung mass is.  Lung cancer.  EXAM: MRI HEAD WITHOUT AND WITH CONTRAST  TECHNIQUE: Multiplanar, multiecho pulse sequences of the brain and surrounding structures were obtained according to standard protocol without and with intravenous contrast  CONTRAST:  20mL MULTIHANCE GADOBENATE DIMEGLUMINE 529 MG/ML IV SOLN  COMPARISON:  None.  FINDINGS: The study is mildly degraded by patient motion. Precontrast examination was done on the evening of 08/25/2013. The patient return for postcontrast imaging on the morning of 08/26/2013. No acute cortical infarct, hemorrhage, or mass lesion is present. Mild generalized atrophy and white matter disease is present, advanced for age. The ventricles are proportionate to the degree of atrophy. No significant extra-axial fluid collections are present. Flow is present in the major intracranial arteries. The globes and orbits are intact.  The postcontrast images are mildly degraded by patient motion. No enhancing lesions are evident. Note is made of diffuse marrow signal loss in the upper cervical spine and calvarium. No discrete lesions are evident.  IMPRESSION: 1. No evidence for metastatic disease of the brain or meninges. 2. Mild generalized atrophy and white  matter disease. This is somewhat advanced for age. This likely reflects the sequela of chronic microvascular ischemia in a patient with multiple risk factors.   Electronically Signed   By: Gennette Pac M.D.   On: 08/26/2013 09:45   Mr Lumbar Spine W Wo Contrast  08/26/2013   CLINICAL DATA:  Lung lesions. Severe back pain.  EXAM: MRI LUMBAR SPINE WITHOUT AND WITH CONTRAST  TECHNIQUE: Multiplanar and multiecho  pulse sequences of the lumbar spine were obtained without and with intravenous contrast.  CONTRAST:  20mL MULTIHANCE GADOBENATE DIMEGLUMINE 529 MG/ML IV SOLN  COMPARISON:  CT abdomen and pelvis without contrast 08/21/2013.  FINDINGS: Normal signal is present in the conus medullaris which terminates at L1. There is diffuse loss of normal T1 marrow signal with extensive heterogeneous enhancement compatible with diffuse osseous metastases. Enhancement and pathologic fractures are evident along the superior endplate of L1 and the superior and inferior endplates of L2. There is no retropulsion of bone. There is a slight pathologic fracture along the superior endplate of L3. Extensive enhancement is noted in the posterior elements of T11, L3, and L4. AP alignment is maintained. Diffuse metastatic lesions are noted within the sacrum and visualized iliac bones. Limited imaging of the abdomen is unremarkable.  The disc levels above L1-2 are normal.  There is focal epidural enhancement posterior to the L2 vertebral body, representing either extraosseous tumor or retropulsed bone. This encroaches on the inferior aspect of the left lateral recess at L1-2.  L2-3: Mild disk bulging and facet hypertrophy are present without significant stenosis.  L3-4: A mild broad-based disc bulge is present. Short pedicles and facet hypertrophy are noted. There is no significant focal stenosis.  L4-5: A broad-based disc herniation and mild facet hypertrophy is evident bilaterally. There is some distortion of the central canal without  significant stenosis. Mild foraminal narrowing is present, left greater than right.  L5-S1: Mild facet hypertrophy and disc bulging is present bilaterally. There is no significant stenosis.  IMPRESSION: 1. Diffuse osseous metastases with pathologic compression fractures at L1, L2, and to a lesser extent at L3. 2. Extraosseous extension of tumor versus slightly retropulsed bone at L2 on the left resulting in mild narrowing along the inferior lateral recess of L1-2, potentially affecting the left L2 nerve roots. 3. Mild broad-based disk bulging at L2-3 and L3-4 without significant stenosis. 4. Disk bulging and facet hypertrophy results in mild lateral recess narrowing at L4-5 greater than right. 5. Facet hypertrophy and minimal bulging at L5-S1 without significant stenosis.   Electronically Signed   By: Gennette Pac M.D.   On: 08/26/2013 10:04    ASSESSMENT: This is a very pleasant 47 years old white male recently diagnosed with metastatic malignant melanoma with lung and bone metastases.   PLAN: I have a lengthy discussion with the patient and his mother today about his current disease stage, prognosis and treatment options. I will complete the staging workup by ordering a whole-body PET scan. I discussed with the patient several options for his treatment, including treatment with IL-2 at one of the facilities which should provide this type of treatment including [facility], [facility] or [facility] in [place]. I think the patient and it would be a good candidate for this treatment based on his age and other comorbidities. He had a recent 2-D echo that showed ejection fraction of 60%. If the patient is not a candidate for IL-2, he may be considered for treatment with either immunotherapy like Yervoy or BRAF inhibitors if he has BRAF mutation.  The patient is interested in the IL-2 option. I will refer him to Dr. Harold Hedge at [facility].  I would see him back for followup  visit in 2 weeks for reevaluation and consideration of other treatment if he is not IL-2 candidate. For pain management I increased his dose of fentanyl patch to 50 mcg/hour every 3 days. The patient was given a  refill for 10 patches. I also gave the patient prescription for OxyContin 10 mg by mouth Q6 hours as needed. For nausea he was given prescription for Zofran 8 mg by mouth every 8 hours as needed for nausea. For the bone metastasis, I may consider the patient for treatment with Xgeva as well as referral to radiation oncology for palliative radiotherapy to the lumbar spines. I gave the patient and his mother the time to ask questions and answers and completed to their satisfaction. He was advised to call immediately if he has any concerning symptoms in the interval. The patient voices understanding of current disease status and treatment options and is in agreement with the current care plan.  All questions were answered. The patient knows to call the clinic with any problems, questions or concerns. We can certainly see the patient much sooner if necessary.  Thank you so much for allowing me to participate in the care of Jeffrey Benitez. I will continue to follow up the patient with you and assist in his care.  I spent 55 minutes counseling the patient face to face. The total time spent in the appointment was 80 minutes.  Recie Cirrincione K. 09/04/2013, 3:58 PM

## 2013-09-06 ENCOUNTER — Telehealth: Payer: Self-pay | Admitting: Internal Medicine

## 2013-09-06 NOTE — Telephone Encounter (Signed)
Pt appt. With Dr. Harold Hedge @ [facility] is 09/14/13@1 :00. Medical records faxed.  Kim at [facility] will request slides and scans if needed.  Called pt no answer- will call again

## 2013-09-13 ENCOUNTER — Telehealth: Payer: Self-pay | Admitting: *Deleted

## 2013-09-13 NOTE — Telephone Encounter (Signed)
Pt called wanting to see if he could take his testosterone injection today.  Per Dr Donnald Garre, hold the testosterone inj until you see Dr Harold Hedge.  SLJ

## 2013-09-14 ENCOUNTER — Encounter (HOSPITAL_COMMUNITY): Payer: Self-pay

## 2013-09-14 ENCOUNTER — Encounter (HOSPITAL_COMMUNITY)
Admission: RE | Admit: 2013-09-14 | Discharge: 2013-09-14 | Disposition: A | Discharge status: Home / Self Care | Payer: Medicaid Other | Source: Ambulatory Visit | Attending: Internal Medicine | Admitting: Internal Medicine

## 2013-09-14 DIAGNOSIS — R222 Localized swelling, mass and lump, trunk: Secondary | ICD-10-CM | POA: Insufficient documentation

## 2013-09-14 DIAGNOSIS — C771 Secondary and unspecified malignant neoplasm of intrathoracic lymph nodes: Secondary | ICD-10-CM | POA: Insufficient documentation

## 2013-09-14 DIAGNOSIS — C7951 Secondary malignant neoplasm of bone: Secondary | ICD-10-CM | POA: Insufficient documentation

## 2013-09-14 DIAGNOSIS — R918 Other nonspecific abnormal finding of lung field: Secondary | ICD-10-CM | POA: Insufficient documentation

## 2013-09-14 DIAGNOSIS — C787 Secondary malignant neoplasm of liver and intrahepatic bile duct: Secondary | ICD-10-CM | POA: Insufficient documentation

## 2013-09-14 DIAGNOSIS — C439 Malignant melanoma of skin, unspecified: Secondary | ICD-10-CM | POA: Insufficient documentation

## 2013-09-14 MED ORDER — FLUDEOXYGLUCOSE F - 18 (FDG) INJECTION
18.6000 | Freq: Once | INTRAVENOUS | Status: AC | PRN
  Administered 2013-09-14: 18.6 via INTRAVENOUS

## 2013-09-19 ENCOUNTER — Telehealth: Payer: Self-pay | Admitting: Medical Oncology

## 2013-09-19 ENCOUNTER — Ambulatory Visit (HOSPITAL_BASED_OUTPATIENT_CLINIC_OR_DEPARTMENT_OTHER): Payer: Medicaid Other | Admitting: Internal Medicine

## 2013-09-19 ENCOUNTER — Telehealth: Payer: Self-pay | Admitting: Internal Medicine

## 2013-09-19 ENCOUNTER — Encounter: Payer: Self-pay | Admitting: Internal Medicine

## 2013-09-19 ENCOUNTER — Other Ambulatory Visit (HOSPITAL_BASED_OUTPATIENT_CLINIC_OR_DEPARTMENT_OTHER): Payer: Medicaid Other | Admitting: Lab

## 2013-09-19 DIAGNOSIS — C7800 Secondary malignant neoplasm of unspecified lung: Secondary | ICD-10-CM

## 2013-09-19 DIAGNOSIS — C4359 Malignant melanoma of other part of trunk: Secondary | ICD-10-CM

## 2013-09-19 DIAGNOSIS — E119 Type 2 diabetes mellitus without complications: Secondary | ICD-10-CM

## 2013-09-19 DIAGNOSIS — C7951 Secondary malignant neoplasm of bone: Secondary | ICD-10-CM

## 2013-09-19 DIAGNOSIS — G893 Neoplasm related pain (acute) (chronic): Secondary | ICD-10-CM

## 2013-09-19 LAB — COMPREHENSIVE METABOLIC PANEL (CC13)
Albumin: 2.6 g/dL — ABNORMAL LOW (ref 3.5–5.0)
Anion Gap: 9 mEq/L (ref 3–11)
CO2: 33 mEq/L — ABNORMAL HIGH (ref 22–29)
Calcium: 9.9 mg/dL (ref 8.4–10.4)
Chloride: 96 mEq/L — ABNORMAL LOW (ref 98–109)
Glucose: 131 mg/dl (ref 70–140)
Potassium: 4.5 mEq/L (ref 3.5–5.1)
Sodium: 137 mEq/L (ref 136–145)
Total Protein: 6.9 g/dL (ref 6.4–8.3)

## 2013-09-19 LAB — CBC WITH DIFFERENTIAL/PLATELET
Basophils Absolute: 0 10*3/uL (ref 0.0–0.1)
Eosinophils Absolute: 0.1 10*3/uL (ref 0.0–0.5)
HGB: 11.9 g/dL — ABNORMAL LOW (ref 13.0–17.1)
MCV: 82.3 fL (ref 79.3–98.0)
MONO%: 7.7 % (ref 0.0–14.0)
NEUT#: 4.1 10*3/uL (ref 1.5–6.5)
RBC: 4.57 10*6/uL (ref 4.20–5.82)
RDW: 15.4 % — ABNORMAL HIGH (ref 11.0–14.6)
lymph#: 1.6 10*3/uL (ref 0.9–3.3)

## 2013-09-19 LAB — TECHNOLOGIST REVIEW

## 2013-09-19 MED ORDER — OXYCODONE HCL 5 MG PO TABS: 5.0000 mg | ORAL_TABLET | ORAL | Status: AC | PRN

## 2013-09-19 NOTE — Telephone Encounter (Signed)
Faxed rx for Mekinist and Dabrafinib to [facility].

## 2013-09-19 NOTE — Progress Notes (Signed)
[facility] Telephone:([phone]   [phone]) [phone]  OFFICE PROGRESS NOTE  Neldon Labella, MD [address] [place] [address]  DIAGNOSIS: Metastatic malignant melanoma diagnosed in October of 2014 was positive BRAF mutation  PRIOR THERAPY: None  CURRENT THERAPY: The patient is expected to start soon systemic therapy with oral Dabrafenib 150 mg by mouth twice a day and Trametinib 2 mg by mouth daily.  INTERVAL HISTORY: Jeffrey Benitez 47 y.o. male returns to the clinic today for followup visit accompanied by his mother. The patient is feeling fine today except for the low back pain in addition to shortness breath with exertion and fatigue. He was recently diagnosed with metastatic malignant melanoma. I referred the patient to Dr. Harold Hedge at [facility] for consideration of interleukin-2 treatment. Unfortunately the patient was not a good candidate for this treatment because of several comorbidities. His lab BRAF mutation was positive. The patient is here today for evaluation and discussion of his treatment options based on the new findings. He denied having any significant weight loss or night sweats. The patient denied having any nausea or vomiting. He denied having any chest pain but continues to have shortness breath with exertion with no cough or hemoptysis. He has no fever or chills. His current pain medication is in the form of Duragesic patch 100 mcg/hour every 3 days in addition to oxycodone 5 mg by mouth every 4 hours as needed for pain. His MRI of the brain showed no evidence for metastatic disease.  MEDICAL HISTORY: Past Medical History  Diagnosis Date  . Kidney stones   . Hypertension   . Diabetes mellitus without complication   . CHF (congestive heart failure)   . Perforated sigmoid colon   . Diverticulitis     ALLERGIES:  has No Known Allergies.  MEDICATIONS:  Current Outpatient Prescriptions  Medication Sig Dispense Refill  .  amLODipine (NORVASC) 10 MG tablet Take 1 tablet (10 mg total) by mouth daily.  30 tablet  0  . fentaNYL (DURAGESIC) 50 MCG/HR Place 1 patch (50 mcg total) onto the skin every 3 (three) days.  10 patch  0  . FLUoxetine (PROZAC) 20 MG capsule Take 1 capsule (20 mg total) by mouth daily.    3  . furosemide (LASIX) 20 MG tablet Take 20 mg by mouth.      . hydrALAZINE (APRESOLINE) 100 MG tablet Take 100 mg by mouth 2 (two) times daily.      Marland Kitchen lisinopril (PRINIVIL,ZESTRIL) 40 MG tablet Take 40 mg by mouth daily.      Marland Kitchen LORazepam (ATIVAN) 0.5 MG tablet Take 0.5 mg by mouth 2 (two) times daily as needed for anxiety.      . metFORMIN (GLUCOPHAGE) 500 MG tablet Take 500 mg by mouth 2 (two) times daily with a meal.      . metoprolol (LOPRESSOR) 100 MG tablet Take 100 mg by mouth 2 (two) times daily.      . ondansetron (ZOFRAN) 4 MG tablet Take 2 tablets (8 mg total) by mouth every 8 (eight) hours as needed.  60 tablet  0  . oxyCODONE (OXY IR/ROXICODONE) 5 MG immediate release tablet Take 1 tablet (5 mg total) by mouth every 4 (four) hours as needed.  40 tablet  0  . potassium chloride (K-DUR) 10 MEQ tablet Take 2 tablets (20 mEq total) by mouth daily.  30 tablet  0  . senna (SENOKOT) 8.6 MG TABS tablet Take  2 tablets (17.2 mg total) by mouth at bedtime.  120 each  0  . testosterone cypionate (DEPOTESTOTERONE CYPIONATE) 100 MG/ML injection Inject 100 mg into the muscle every 14 (fourteen) days. For IM use only       No current facility-administered medications for this visit.    SURGICAL HISTORY:  Past Surgical History  Procedure Laterality Date  . Kidney stone surgery    . Colon surgery    . Colon ressection    . Video bronchoscopy Bilateral 08/23/2013    Procedure: VIDEO BRONCHOSCOPY WITH FLUORO;  Surgeon: Leslye Peer, MD;  Location: [facility] ENDOSCOPY;  Service: Cardiopulmonary;  Laterality: Bilateral;  . Video bronchoscopy with endobronchial ultrasound Right 08/25/2013    Procedure: VIDEO  BRONCHOSCOPY WITH ENDOBRONCHIAL ULTRASOUND;  Surgeon: Leslye Peer, MD;  Location: [facility] OR;  Service: Thoracic;  Laterality: Right;    REVIEW OF SYSTEMS:  Constitutional: positive for fatigue Eyes: negative Ears, nose, mouth, throat, and face: negative Respiratory: positive for dyspnea on exertion Cardiovascular: negative Gastrointestinal: negative Genitourinary:negative Integument/breast: negative Hematologic/lymphatic: negative Musculoskeletal:positive for back pain Neurological: negative Behavioral/Psych: negative Endocrine: negative Allergic/Immunologic: negative   PHYSICAL EXAMINATION: General appearance: alert, cooperative, fatigued and no distress Head: Normocephalic, without obvious abnormality, atraumatic Neck: no adenopathy, no JVD, supple, symmetrical, trachea midline and thyroid not enlarged, symmetric, no tenderness/mass/nodules Lymph nodes: Cervical, supraclavicular, and axillary nodes normal. Resp: clear to auscultation bilaterally Back: symmetric, no curvature. ROM normal. No CVA tenderness. Cardio: regular rate and rhythm, S1, S2 normal, no murmur, click, rub or gallop GI: soft, non-tender; bowel sounds normal; no masses,  no organomegaly Extremities: extremities normal, atraumatic, no cyanosis or edema Neurologic: Alert and oriented X 3, normal strength and tone. Normal symmetric reflexes. Normal coordination and gait  ECOG PERFORMANCE STATUS: 1 - Symptomatic but completely ambulatory  Blood pressure 169/93, pulse 85, temperature 99.2 F (37.3 C), temperature source Oral, resp. rate 18, height 5\' 8"  (1.727 m), weight 317 lb 11.2 oz (144.108 kg).  LABORATORY DATA: Lab Results  Component Value Date   WBC 8.3 09/04/2013   HGB 13.1 09/04/2013   HCT 41.4 09/04/2013   MCV 82.4 09/04/2013   PLT 251 09/04/2013      Chemistry      Component Value Date/Time   NA 137 09/19/2013 1435   NA 135 08/25/2013 0428   K 4.5 09/19/2013 1435   K 3.8 08/25/2013 0428   CL 96  08/25/2013 0428   CO2 33* 09/19/2013 1435   CO2 27 08/25/2013 0428   BUN 16.7 09/19/2013 1435   BUN 13 08/25/2013 0428   CREATININE 1.0 09/19/2013 1435   CREATININE 0.88 08/25/2013 0428      Component Value Date/Time   CALCIUM 9.9 09/19/2013 1435   CALCIUM 9.2 08/25/2013 0428   ALKPHOS 179* 09/19/2013 1435   ALKPHOS 111 08/22/2013 0622   AST 17 09/19/2013 1435   AST 16 08/22/2013 0622   ALT 11 09/19/2013 1435   ALT 11 08/22/2013 0622   BILITOT 0.46 09/19/2013 1435   BILITOT 0.6 08/22/2013 0622       RADIOGRAPHIC STUDIES: Ct Abdomen Pelvis Wo Contrast  08/21/2013   CLINICAL DATA:  Nephrolithiasis. Left flank pain  EXAM: CT ABDOMEN AND PELVIS WITHOUT CONTRAST  TECHNIQUE: Multidetector CT imaging of the abdomen and pelvis was performed following the standard protocol without intravenous contrast.  COMPARISON:  CT 07/08/2010  FINDINGS: Nonobstructing small renal calculi on the left. 2 mm stones are present left upper, mid, and lower pole. No ureteral  calculi. No right renal calculi. 2 cm right renal cyst.  1 cm hypodensity right lobe of the liver posteriorly is indeterminate but probably benign. This is not seen on the prior study however there was significant streak artifact through the liver. Pancreas and spleen are normal.  Negative for bowel obstruction. Appendix is normal. No free fluid, mass, or adenopathy.  2 cm pleural based density right lateral lung base, not definitely confirmed on prior studies. This has soft tissue density. CT chest is recommended for further evaluation.  IMPRESSION: 3 nonobstructive stones left kidney.  No ureteral calculi.  2 cm pleural based density right lateral lung base. Recommend chest CT for further evaluation.   Electronically Signed   By: Marlan Palau M.D.   On: 08/21/2013 23:23   Dg Chest 2 View  08/22/2013   CLINICAL DATA:  Short of breath, hypoxemia  EXAM: CHEST  2 VIEW  COMPARISON:  09/12/2010. CT abdomen today 8  FINDINGS: Abnormal density on  the right. There is a probable right upper lobe mass measuring approximately 3 cm with right peritracheal adenopathy. There is right pleural thickening. These findings were not present in 2011. No layering effusion.  Cardiac enlargement without heart failure.  Left lung clear.  IMPRESSION: Right lung density with right peritracheal adenopathy. Findings are worrisome for carcinoma of the lung. CT chest with contrast recommended for further evaluation.   Electronically Signed   By: Marlan Palau M.D.   On: 08/22/2013 00:35   Ct Chest W Contrast  08/22/2013   CLINICAL DATA:  Shortness of breath. Chest mass on x-ray.  EXAM: CT CHEST WITH CONTRAST  TECHNIQUE: Multidetector CT imaging of the chest was performed during intravenous contrast administration.  CONTRAST:  OMNIPAQUE IOHEXOL 300 MG/ML  SOLN  COMPARISON:  Chest radiograph 08/22/2013  FINDINGS: Multiple bilateral pulmonary masses. The largest mass is in the right upper lung anteriorly, measuring 5.2 x 5.4 cm. Multiple additional right lung masses are present, measuring up to 4.7 x 2.9 cm diameter. A single mass is demonstrated in the left upper lung posteriorly, measuring 1.5 cm in diameter. The findings are consistent with primary and/or metastatic disease.  Right hilar lymphadenopathy with lymph nodes measuring up to about 3.8 x 2.5 cm. Subcarinal lymphadenopathy and right pretracheal lymphadenopathy is also present.  Normal heart size. Normal caliber thoracic aorta. Coronary artery calcification. The esophagus is decompressed. No pleural effusions. No pneumothorax.  Visualized portions of the upper abdominal organs demonstrate mild fatty infiltration of the liver.  Degenerative changes in the thoracic spine. No destructive bone lesions are appreciated.  IMPRESSION: Right upper lung mass measuring up to 5.4 cm diameter with multiple additional right lung mass is and a single left lung nodule. Lymphadenopathy in the right hilum and mediastinum. Changes  are consistent with primary and/ or metastatic disease.   Electronically Signed   By: Burman Nieves M.D.   On: 08/22/2013 02:19   Mr Laqueta Jean WU Contrast  08/26/2013   CLINICAL DATA:  New found lung mass is.  Lung cancer.  EXAM: MRI HEAD WITHOUT AND WITH CONTRAST  TECHNIQUE: Multiplanar, multiecho pulse sequences of the brain and surrounding structures were obtained according to standard protocol without and with intravenous contrast  CONTRAST:  20mL MULTIHANCE GADOBENATE DIMEGLUMINE 529 MG/ML IV SOLN  COMPARISON:  None.  FINDINGS: The study is mildly degraded by patient motion. Precontrast examination was done on the evening of 08/25/2013. The patient return for postcontrast imaging on the morning of 08/26/2013. No acute  cortical infarct, hemorrhage, or mass lesion is present. Mild generalized atrophy and white matter disease is present, advanced for age. The ventricles are proportionate to the degree of atrophy. No significant extra-axial fluid collections are present. Flow is present in the major intracranial arteries. The globes and orbits are intact.  The postcontrast images are mildly degraded by patient motion. No enhancing lesions are evident. Note is made of diffuse marrow signal loss in the upper cervical spine and calvarium. No discrete lesions are evident.  IMPRESSION: 1. No evidence for metastatic disease of the brain or meninges. 2. Mild generalized atrophy and white matter disease. This is somewhat advanced for age. This likely reflects the sequela of chronic microvascular ischemia in a patient with multiple risk factors.   Electronically Signed   By: Gennette Pac M.D.   On: 08/26/2013 09:45   Mr Lumbar Spine W Wo Contrast  08/26/2013   CLINICAL DATA:  Lung lesions. Severe back pain.  EXAM: MRI LUMBAR SPINE WITHOUT AND WITH CONTRAST  TECHNIQUE: Multiplanar and multiecho pulse sequences of the lumbar spine were obtained without and with intravenous contrast.  CONTRAST:  20mL MULTIHANCE  GADOBENATE DIMEGLUMINE 529 MG/ML IV SOLN  COMPARISON:  CT abdomen and pelvis without contrast 08/21/2013.  FINDINGS: Normal signal is present in the conus medullaris which terminates at L1. There is diffuse loss of normal T1 marrow signal with extensive heterogeneous enhancement compatible with diffuse osseous metastases. Enhancement and pathologic fractures are evident along the superior endplate of L1 and the superior and inferior endplates of L2. There is no retropulsion of bone. There is a slight pathologic fracture along the superior endplate of L3. Extensive enhancement is noted in the posterior elements of T11, L3, and L4. AP alignment is maintained. Diffuse metastatic lesions are noted within the sacrum and visualized iliac bones. Limited imaging of the abdomen is unremarkable.  The disc levels above L1-2 are normal.  There is focal epidural enhancement posterior to the L2 vertebral body, representing either extraosseous tumor or retropulsed bone. This encroaches on the inferior aspect of the left lateral recess at L1-2.  L2-3: Mild disk bulging and facet hypertrophy are present without significant stenosis.  L3-4: A mild broad-based disc bulge is present. Short pedicles and facet hypertrophy are noted. There is no significant focal stenosis.  L4-5: A broad-based disc herniation and mild facet hypertrophy is evident bilaterally. There is some distortion of the central canal without significant stenosis. Mild foraminal narrowing is present, left greater than right.  L5-S1: Mild facet hypertrophy and disc bulging is present bilaterally. There is no significant stenosis.  IMPRESSION: 1. Diffuse osseous metastases with pathologic compression fractures at L1, L2, and to a lesser extent at L3. 2. Extraosseous extension of tumor versus slightly retropulsed bone at L2 on the left resulting in mild narrowing along the inferior lateral recess of L1-2, potentially affecting the left L2 nerve roots. 3. Mild broad-based  disk bulging at L2-3 and L3-4 without significant stenosis. 4. Disk bulging and facet hypertrophy results in mild lateral recess narrowing at L4-5 greater than right. 5. Facet hypertrophy and minimal bulging at L5-S1 without significant stenosis.   Electronically Signed   By: Gennette Pac M.D.   On: 08/26/2013 10:04   Nm Pet Image Initial (pi) Whole Body  09/14/2013   CLINICAL DATA:  Initial treatment strategy for melanoma.  EXAM: NUCLEAR MEDICINE PET SKULL BASE TO THIGH  FASTING BLOOD GLUCOSE:  Value: 127mg /dl  TECHNIQUE: 45.4 mCi U-98 FDG was injected intravenously. CT data  was obtained and used for attenuation correction and anatomic localization only. (This was not acquired as a diagnostic CT examination.) Additional exam technical data entered on technologist worksheet.  COMPARISON:  08/21/2013  FINDINGS: NECK  No hypermetabolic lymph nodes in the neck.  CHEST  Right upper lobe lung mass measures 7 cm and has an SUV max equal to 20 point 5. Anterior stress set superior segment of right lower lobe tumor measures 6.4 cm. This has an SUV equal to 27.6, image 127. Hypermetabolic adenopathy is identified within the right hilar region as well as the mediastinum. Index right paratracheal lymph node measures 2 cm and has an SUV max equal to 13.8, image 111. Smaller pulmonary nodules are scattered throughout the right lung and left upper lobe.  ABDOMEN/PELVIS  Multifocal hypermetabolic liver lesions are identified. Index lesion within the inferior right hepatic lobe measures 4.9 cm and has an SUV max equal to 21.1 within the lateral segment of left hepatic lobe there is a hypermetabolic lesion measuring 3.1 cm. This has an SUV max equal to 14.4, image 160.  SKELETON  Multifocal hypermetabolic bone metastases identified. Index lesion within the proximal right humerus has an SUV max equal to 13.4, image 84. Hypermetabolic, lytic lesion involving the manubrium has an SUV max equal to 8.9, image 100. There is intense  radiotracer uptake identified throughout the spine and bony pelvis. Abnormal increased uptake within the right femoral neck has an SUV max equal to 14.4, image 271.  IMPRESSION: 1. Examination is positive for diffuse metastatic disease throughout the chest, abdomen and pelvis. 2. Multi focal hypermetabolic pulmonary nodules and masses are identified compatible with metastatic disease. 3. Hypermetabolic mediastinal and right hilar lymph node metastasis. 4. Multi focal liver metastasis. 5. Extensive bone metastasis.   Electronically Signed   By: Signa Kell M.D.   On: 09/14/2013 14:12    ASSESSMENT AND PLAN: This is a very pleasant 47 years old white male recently diagnosed with metastatic malignant melanoma with positive BRAF mutation. I have a lengthy discussion with the patient and his mother today about his current disease stage, prognosis and treatment options. I gave the patient the option of consideration of systemic therapy versus palliative care and hospice referral. The patient is interested in proceeding with treatment. I would consider him for treatment with Dabrafenib 150 mg by mouth twice a day in addition to Trametinib 2 mg by mouth daily. He currently does not have any insurance coverage for medications. We will work with some of the drug assistance program to help with his medication coverage. I discussed with the patient adverse effect of this treatment including but not limited to skin rash, diarrhea, interstitial lung disease, deep venous thrombosis, cardiomyopathy, ocular toxicity and hyperglycemia. The patient is diabetic and he would need to have good control of his hyperglycemia by his primary care physician. For the back pain, I referred the patient to radiation oncology for consideration of palliative radiotherapy to the metastatic bone disease in the lower lumbar spines. For pain management he will continue on Duragesic patch 100 mcg/hour every 3 days in addition to oxycodone 5  mg by mouth every 4 hours as needed. I will arrange for the patient to have a chemotherapy education program before starting the first dose of his treatment. He would come back for followup visit in 2 weeks for evaluation. He was advised to call immediately if he has any concerning symptoms in the interval. The patient voices understanding of current disease status and treatment  options and is in agreement with the current care plan.  All questions were answered. The patient knows to call the clinic with any problems, questions or concerns. We can certainly see the patient much sooner if necessary.  I spent 20 minutes counseling the patient face to face. The total time spent in the appointment was 30 minutes.

## 2013-09-19 NOTE — Telephone Encounter (Signed)
appts made per 11/18 POF CAL and AVS given to pt shh

## 2013-09-19 NOTE — Patient Instructions (Signed)
We discussed your treatment options today including oral treatment with Dabrafenib and Trametinib. Referral to radiation oncology for consideration of palliative radiotherapy to the metastatic bone lesions. Followup visit in 2 weeks.

## 2013-09-20 ENCOUNTER — Telehealth: Payer: Self-pay | Admitting: Medical Oncology

## 2013-09-20 NOTE — Telephone Encounter (Addendum)
Received pt assistance application for mekinist and tafinlar. I gave packet to [place].

## 2013-09-21 ENCOUNTER — Encounter: Payer: Self-pay | Admitting: Radiation Oncology

## 2013-09-21 NOTE — Progress Notes (Deleted)
Thoracic Location of Tumor / Histology: Malignant Melanoma with Metastases to the Right Lung and Lumbar spine  Presented with Low back pain of 3 weeks duration on 08/21/13. CT of his abdomen revealed a 2 CM mass in the right lateral lung base.  CT scan of the chest with contrast on 08/22/2013 and it showed multiple bilateral pulmonary masses. The largest mass is in the right upper lung anteriorly, measuring 5.2 x 5.4 cm. Multiple additional right lung masses are present, measuring up to 4.7 x 2.9 cm diameter. A single mass is demonstrated in the left upper lung posteriorly, measuring 1.5 cm in diameter. The findings are consistent with primary and/or metastatic disease. Right hilar lymphadenopathy with lymph nodes measuring up to about 3.8 x 2.5 cm. Subcarinal lymphadenopathy and right pretracheal   Noted to have exertional Dyspnea and fatigue.    Biopsies of Right Hilar Mass  Diagnosis NEEDLE ASPIRATION: NEEDLE ASPIRATION: ENDOSCOPIC EBUS #1, RIGHT HILAR MASS:(SPECIMEN 1 OF 5 COLLECTED ON 08/25/2013). MALIGNANT CELLS PRESENT, SEE COMMENT. COMMENT: THE MALIGNANT CELLS ARE CONSISTENT WITH MALIGNANT MELANOMA   Diagnosis FINE NEEDLE ASPIRATION: ENDOSCOPIC EBUS #2, 7 NODE (SPECIMEN 2 OF 5, COLLECTED ON 08/25/2013) MALIGNANT CELLS PRESENT, SEE COMMENT. COMMENT: THE MORPHOLOGY AND IMMUNOPHENOTYPE OF THE MALIGNANT CELLS ARE CONSISTENT WITH MALIGNANT MELANOMA. THE CASE WAS REVIEWED WITH DR Raynald Blend, WHO CONCURS. PER DR BYRUM REQUEST, BRAF MUTATIONAL TESTING WILL BE PREFORMED ON CELL BLOCK.  Diagnosis FINE NEEDLE ASPIRATION NEEDLE ASPIRATION, EBUS #3, 11R (SPECIMEN 3 OR 5, COLLECTED ON 08/25/13): ATYPICAL CELLS PRESENT SEE COMMENT COMMENT: THERE ARE ATYPICAL CELLS PRESENT THAT ARE NOT DIAGNOSTIC OF MALIGNANCY.  Diagnosis FINE NEEDLE ASPIRATION, EBUS #4, RIGHT HILAR MASS #2 (SPECIMEN 3 OR 5, COLLECTED ON 08/25/13): MALIGNANT CELLS PRESENT, SEE COMMENT. COMMENT: THE MALIGNANT CELLS ARE  CONSISTENT WITH MALIGNANT MELANOMA   MRI of the lumbar spine on 08/26/2013 showed Diffuse osseous metastases with pathologic compression fractures at L1, L2, and to a lesser extent at L3. Extraosseous extension of tumor versus slightly retropulsed bone at L2 on the left resulting in mild narrowing along the inferior lateral recess of L1-2, potentially affecting the left L2 nerve roots. Mild broad-based disk bulging at L2-3 and L3-4 without  significant stenosis     Tobacco/Marijuana/Snuff/ETOH use: Stopped smoking 16 years ago, Drinks alcohol socially on the weekends,  No Drug use  Past/Anticipated interventions by cardiothoracic surgery, if any: Biopsies  Past/Anticipated interventions by medical oncology, if any: systemic therapy with oral Dabrafenib 150 mg by mouth twice a day and Trametinib 2 mg by mouth daily.   Signs/Symptoms  Weight changes, if any: Denied weight loss - 09/19/13. Note change from 326.9 lbs on 09/04/13 to 317.7 bs on 09/19/13  Respiratory complaints, if any: SOB on exertion  Hemoptysis, if any: No   Pain issues, if any:  Low back pain :  MRI of the lumbar spine on 08/26/2013 showed Diffuse osseous metastases with pathologic compression fractures at L1, L2, and to a lesser extent at L3. Extraosseous extension of tumor versus slightly retropulsed bone at L2 on the left resulting in mild narrowing along the inferior lateral recess of L1-2, potentially affecting the left L2 nerve roots. Mild broad-based disk bulging at L2-3 and L3-4 without  significant stenosis   SAFETY ISSUES:  Prior radiation? No  Pacemaker/ICD? No  Possible current pregnancy? N/A  Is the patient on methotrexate? No  Current Complaints / other details:  Morbidly Obese  Malignant melanoma of the skin on left lower back.   MRI  08/26/13 negative for Brain metastases.

## 2013-09-21 NOTE — Progress Notes (Signed)
Histology and Location of Primary Cancer:Metastatic Melanoma to the Lumbar spine  Sites of Visceral and Bony Metastatic Disease: Presented with 3 week history of low back pain  MRI of the lumbar spine on 08/26/2013 showed Diffuse osseous metastases with pathologic compression fractures at L1, L2, and to a lesser extent at L3. Extraosseous extension of tumor versus slightly retropulsed bone at L2 on the left resulting in mild narrowing along the inferior lateral recess of L1-2, potentially affecting the left L2 nerve roots. Mild broad-based disk bulging at L2-3 and L3-4 without  significant stenosis. Disk bulging and facet hypertrophy results in mild lateral recess narrowing at L4-5 greater than right. Facet hypertrophy and minimal bulging at L5-S1 without significant stenosis  Other sites of Metastases: CT scan of the chest with contrast on 08/22/2013 and it showed multiple bilateral pulmonary masses. The largest mass is in the right upper lung anteriorly, measuring 5.2 x 5.4 cm. Multiple additional right lung masses are present, measuring up to 4.7 x 2.9 cm diameter. A single mass is demonstrated in the left upper lung posteriorly, measuring 1.5 cm in diameter. The findings are consistent with primary and/or metastatic disease. Right hilar lymphadenopathy with lymph nodes measuring up to about 3.8 x 2.5 cm. Subcarinal lymphadenopathy and right pretracheal   Melanoma; Left Lower back At waist Line    Location(s) of Symptomatic Metastases: L1 L2 and L3  Past/Anticipated chemotherapy by medical oncology, if any: Systemic therapy with oral Dabrafenib 150 mg by mouth twice a day and Trametinib 2 mg by mouth daily.   Pain on a scale of 0-10 is: Low back pain as a level 6 on a scale of 0-10.   If Spine Met(s), symptoms, if any, include:  Bowel/Bladder retention or incontinence (please describe): Reports difficulty initiating urine flow.  Straining "sometimes".  Constipation presently since starting  Fentanyl patch and Oxycodone.  Using Miralax BID   Numbness or weakness in extremities (please describe): Weakness right leg, but denies numbness  Current Decadron regimen, if applicable: No  Ambulatory status? Walker? Wheelchair?: Wheelchair, but ambulating at home  SAFETY ISSUES:  Prior radiation? No  Pacemaker/ICD? No  Possible current pregnancy? N/A  Is the patient on methotrexate? No  Current Complaints / other details:  Low Back Pain, Exertional Dyspnea, and fatigue

## 2013-09-22 ENCOUNTER — Telehealth: Payer: Self-pay | Admitting: Oncology

## 2013-09-22 ENCOUNTER — Ambulatory Visit (HOSPITAL_COMMUNITY)
Admission: RE | Admit: 2013-09-22 | Discharge: 2013-09-22 | Disposition: A | Discharge status: Home / Self Care | Payer: Medicaid Other | Source: Ambulatory Visit | Attending: Radiation Oncology | Admitting: Radiation Oncology

## 2013-09-22 ENCOUNTER — Ambulatory Visit
Admission: RE | Admit: 2013-09-22 | Discharge: 2013-09-22 | Disposition: A | Discharge status: Home / Self Care | Payer: Medicaid Other | Source: Ambulatory Visit | Attending: Radiation Oncology | Admitting: Radiation Oncology

## 2013-09-22 ENCOUNTER — Telehealth: Payer: Self-pay | Admitting: Medical Oncology

## 2013-09-22 ENCOUNTER — Other Ambulatory Visit: Payer: Self-pay | Admitting: Medical Oncology

## 2013-09-22 ENCOUNTER — Other Ambulatory Visit: Payer: Self-pay | Admitting: Radiation Oncology

## 2013-09-22 VITALS — BP 113/71 | HR 80 | Temp 99.1°F | Ht 68.0 in | Wt 318.4 lb

## 2013-09-22 VITALS — BP 113/71 | HR 80 | Temp 99.1°F | Resp 16 | Ht 68.0 in | Wt 318.4 lb

## 2013-09-22 DIAGNOSIS — M25559 Pain in unspecified hip: Secondary | ICD-10-CM | POA: Insufficient documentation

## 2013-09-22 DIAGNOSIS — C7800 Secondary malignant neoplasm of unspecified lung: Secondary | ICD-10-CM

## 2013-09-22 DIAGNOSIS — C7951 Secondary malignant neoplasm of bone: Secondary | ICD-10-CM

## 2013-09-22 HISTORY — DX: Malignant melanoma of other part of trunk: C43.59

## 2013-09-22 MED ORDER — TRAMETINIB DIMETHYL SULFOXIDE 2 MG PO TABS: 2.0000 mg | ORAL_TABLET | Freq: Every day | ORAL | Status: AC

## 2013-09-22 MED ORDER — LIDOCAINE-PRILOCAINE 2.5-2.5 % EX CREA
TOPICAL_CREAM | Freq: Once | CUTANEOUS | Status: DC
  Filled 2013-09-22: qty 5

## 2013-09-22 MED ORDER — DABRAFENIB MESYLATE 50 MG PO CAPS: 150.0000 mg | ORAL_CAPSULE | Freq: Two times a day (BID) | ORAL | Status: AC

## 2013-09-22 NOTE — Progress Notes (Signed)
Radiation Oncology         [phone]) [phone] ________________________________  Initial outpatient Consultation - Date: 09/22/2013   Name: Jeffrey Benitez MRN: [id number]   DOB: Jul 19, 1966  REFERRING PHYSICIAN: Si Gaul, MD  DIAGNOSIS:  1. Bone metastases    HISTORY OF PRESENT ILLNESS::Jeffrey Benitez is a 47 y.o. male  presented with a two-month history of left flank pain. He felt this may be due to a kidney stone. A CT scan was performed which showed lesions in his lung. He was also noted to have a lesion on his back. He underwent excision of the left lower back lesion which showed a malignant melanoma stage as a T3a biopsy was taken of several lymph nodes all which showed malignant melanoma. He was referred to [facility] and was not felt to be a good candidate for interleukin-2. A staging PET scan was performed on 09/14/2013 which showed a 7 cm right upper lobe mass. Other areas of abnormality were noted in the  chest including multiple pulmonary nodules, mediastinal, and hilar adenopathy. Multifocal liver disease was also noted as well as many bone metastases including the right humerus, spine and bony pelvis. The right femoral neck had a lesion with an SUV of 14.4. An MRI of the brain on 08/26/2013 showed no evidence of metastatic disease. An MRI of the L-spine on 08/26/2013 showed diffuse osseous metastatic disease with compression fractures at L1-L2 and L3. There was made of extraosseous extension of tumor versus bone fragments at L2 resulting in narrowing along the left L2 nerve roots. Jeffrey Benitez was referred by Dr. Arbutus Ped to me today for consideration of palliative radiation to his lumbar spine. He describes this pain as sharp in nature. He has had some right-sided leg weakness and pain when walking. He can't name Leonia Corona around his house but tires very easily with minimal exertion he has no headaches. He has no pain in his arm. He reports no, to assess. He does complain of some decreased  urinary flow but does not feel he has urinary retention. He also has some constipation which has resolved recently. He is on Duragesic 100 mcg every 3 days in addition to oxycodone which he takes 2 or 3 times a day. He is accompanied by his mother today. She actually slept through most of the consultation.  PREVIOUS RADIATION THERAPY: No  PAST MEDICAL HISTORY:  has a past medical history of Kidney stones; Hypertension; Diabetes mellitus without complication; CHF (congestive heart failure); Perforated sigmoid colon; Diverticulitis; and Melanoma of back (08/28/13).    PAST SURGICAL HISTORY: Past Surgical History  Procedure Laterality Date  . Kidney stone surgery    . Colon surgery    . Colon ressection    . Video bronchoscopy Bilateral 08/23/2013    Procedure: VIDEO BRONCHOSCOPY WITH FLUORO;  Surgeon: Leslye Peer, MD;  Location: [facility] ENDOSCOPY;  Service: Cardiopulmonary;  Laterality: Bilateral;  . Video bronchoscopy with endobronchial ultrasound Right 08/25/2013    Procedure: VIDEO BRONCHOSCOPY WITH ENDOBRONCHIAL ULTRASOUND;  Surgeon: Leslye Peer, MD;  Location: [facility] OR;  Service: Thoracic;  Laterality: Right;  . Biospy of left lower back Left 08/28/13    Left Lower Back    FAMILY HISTORY:  Family History  Problem Relation Age of Onset  . Diabetes type II Mother   . Hypertension Father   . Diabetes type II Father   . Heart disease Father     SOCIAL HISTORY:  History  Substance Use Topics  . Smoking status:  Former Smoker    Types: Cigarettes  . Smokeless tobacco: Former Neurosurgeon    Quit date: 08/22/1999  . Alcohol Use: Yes     Comment: Socially on the weekends    ALLERGIES: Review of patient's allergies indicates no known allergies.  MEDICATIONS:  Current Outpatient Prescriptions  Medication Sig Dispense Refill  . fentaNYL (DURAGESIC) 100 MCG/HR Place 100 mcg onto the skin every 3 (three) days.      Marland Kitchen FLUoxetine (PROZAC) 20 MG capsule Take 1 capsule (20 mg total) by mouth  daily.    3  . furosemide (LASIX) 20 MG tablet Take 20 mg by mouth.      . hydrALAZINE (APRESOLINE) 100 MG tablet Take 100 mg by mouth 2 (two) times daily.      Marland Kitchen lisinopril (PRINIVIL,ZESTRIL) 40 MG tablet Take 40 mg by mouth daily.      Marland Kitchen LORazepam (ATIVAN) 0.5 MG tablet Take 0.5 mg by mouth 2 (two) times daily as needed for anxiety.      . metFORMIN (GLUCOPHAGE) 500 MG tablet Take 500 mg by mouth 2 (two) times daily with a meal.      . metoprolol (LOPRESSOR) 100 MG tablet Take 100 mg by mouth 2 (two) times daily.      . ondansetron (ZOFRAN) 4 MG tablet Take 2 tablets (8 mg total) by mouth every 8 (eight) hours as needed.  60 tablet  0  . oxyCODONE (OXY IR/ROXICODONE) 5 MG immediate release tablet Take 1 tablet (5 mg total) by mouth every 4 (four) hours as needed.  60 tablet  0  . polyethylene glycol (MIRALAX / GLYCOLAX) packet Take 17 g by mouth 2 (two) times daily.      Marland Kitchen senna (SENOKOT) 8.6 MG TABS tablet Take 2 tablets (17.2 mg total) by mouth at bedtime.  120 each  0  . testosterone cypionate (DEPOTESTOTERONE CYPIONATE) 100 MG/ML injection Inject 100 mg into the muscle every 14 (fourteen) days. For IM use only       No current facility-administered medications for this encounter.    REVIEW OF SYSTEMS:  A 15 point review of systems is documented in the electronic medical record. This was obtained by the nursing staff. However, I reviewed this with the patient to discuss relevant findings and make appropriate changes.  Pertinent items are noted in HPI.  PHYSICAL EXAM:  Filed Vitals:   09/22/13 0953  BP: 113/71  Pulse: 80  Temp: 99.1 F (37.3 C)  Resp: 16  .318 lb 6.4 oz (144.425 kg). He is an obese male in no distress sitting comfortably in a wheelchair. He has 5 out of 5 strength in his bilateral upper extremities. 5 out of 5 strength in his left lower extremity and 4/5 strength in his right lower extremity which is limited by pain. He has intact sensation to light touch bilaterally.  Cranial nerves III through XII are tested and intact.  LABORATORY DATA:  Lab Results  Component Value Date   WBC 6.2 09/19/2013   HGB 11.9* 09/19/2013   HCT 37.6* 09/19/2013   MCV 82.3 09/19/2013   PLT 225 Large platelets present 09/19/2013   Lab Results  Component Value Date   NA 137 09/19/2013   K 4.5 09/19/2013   CL 96 08/25/2013   CO2 33* 09/19/2013   Lab Results  Component Value Date   ALT 11 09/19/2013   AST 17 09/19/2013   ALKPHOS 179* 09/19/2013   BILITOT 0.46 09/19/2013     RADIOGRAPHY: Mr Brain  W Wo Contrast  08/26/2013   CLINICAL DATA:  New found lung mass is.  Lung cancer.  EXAM: MRI HEAD WITHOUT AND WITH CONTRAST  TECHNIQUE: Multiplanar, multiecho pulse sequences of the brain and surrounding structures were obtained according to standard protocol without and with intravenous contrast  CONTRAST:  20mL MULTIHANCE GADOBENATE DIMEGLUMINE 529 MG/ML IV SOLN  COMPARISON:  None.  FINDINGS: The study is mildly degraded by patient motion. Precontrast examination was done on the evening of 08/25/2013. The patient return for postcontrast imaging on the morning of 08/26/2013. No acute cortical infarct, hemorrhage, or mass lesion is present. Mild generalized atrophy and white matter disease is present, advanced for age. The ventricles are proportionate to the degree of atrophy. No significant extra-axial fluid collections are present. Flow is present in the major intracranial arteries. The globes and orbits are intact.  The postcontrast images are mildly degraded by patient motion. No enhancing lesions are evident. Note is made of diffuse marrow signal loss in the upper cervical spine and calvarium. No discrete lesions are evident.  IMPRESSION: 1. No evidence for metastatic disease of the brain or meninges. 2. Mild generalized atrophy and white matter disease. This is somewhat advanced for age. This likely reflects the sequela of chronic microvascular ischemia in a patient with multiple  risk factors.   Electronically Signed   By: Gennette Pac M.D.   On: 08/26/2013 09:45   Mr Lumbar Spine W Wo Contrast  08/26/2013   CLINICAL DATA:  Lung lesions. Severe back pain.  EXAM: MRI LUMBAR SPINE WITHOUT AND WITH CONTRAST  TECHNIQUE: Multiplanar and multiecho pulse sequences of the lumbar spine were obtained without and with intravenous contrast.  CONTRAST:  20mL MULTIHANCE GADOBENATE DIMEGLUMINE 529 MG/ML IV SOLN  COMPARISON:  CT abdomen and pelvis without contrast 08/21/2013.  FINDINGS: Normal signal is present in the conus medullaris which terminates at L1. There is diffuse loss of normal T1 marrow signal with extensive heterogeneous enhancement compatible with diffuse osseous metastases. Enhancement and pathologic fractures are evident along the superior endplate of L1 and the superior and inferior endplates of L2. There is no retropulsion of bone. There is a slight pathologic fracture along the superior endplate of L3. Extensive enhancement is noted in the posterior elements of T11, L3, and L4. AP alignment is maintained. Diffuse metastatic lesions are noted within the sacrum and visualized iliac bones. Limited imaging of the abdomen is unremarkable.  The disc levels above L1-2 are normal.  There is focal epidural enhancement posterior to the L2 vertebral body, representing either extraosseous tumor or retropulsed bone. This encroaches on the inferior aspect of the left lateral recess at L1-2.  L2-3: Mild disk bulging and facet hypertrophy are present without significant stenosis.  L3-4: A mild broad-based disc bulge is present. Short pedicles and facet hypertrophy are noted. There is no significant focal stenosis.  L4-5: A broad-based disc herniation and mild facet hypertrophy is evident bilaterally. There is some distortion of the central canal without significant stenosis. Mild foraminal narrowing is present, left greater than right.  L5-S1: Mild facet hypertrophy and disc bulging is present  bilaterally. There is no significant stenosis.  IMPRESSION: 1. Diffuse osseous metastases with pathologic compression fractures at L1, L2, and to a lesser extent at L3. 2. Extraosseous extension of tumor versus slightly retropulsed bone at L2 on the left resulting in mild narrowing along the inferior lateral recess of L1-2, potentially affecting the left L2 nerve roots. 3. Mild broad-based disk bulging at L2-3 and  L3-4 without significant stenosis. 4. Disk bulging and facet hypertrophy results in mild lateral recess narrowing at L4-5 greater than right. 5. Facet hypertrophy and minimal bulging at L5-S1 without significant stenosis.   Electronically Signed   By: Gennette Pac M.D.   On: 08/26/2013 10:04   Nm Pet Image Initial (pi) Whole Body  09/14/2013   CLINICAL DATA:  Initial treatment strategy for melanoma.  EXAM: NUCLEAR MEDICINE PET SKULL BASE TO THIGH  FASTING BLOOD GLUCOSE:  Value: 127mg /dl  TECHNIQUE: 98.1 mCi X-91 FDG was injected intravenously. CT data was obtained and used for attenuation correction and anatomic localization only. (This was not acquired as a diagnostic CT examination.) Additional exam technical data entered on technologist worksheet.  COMPARISON:  08/21/2013  FINDINGS: NECK  No hypermetabolic lymph nodes in the neck.  CHEST  Right upper lobe lung mass measures 7 cm and has an SUV max equal to 20 point 5. Anterior stress set superior segment of right lower lobe tumor measures 6.4 cm. This has an SUV equal to 27.6, image 127. Hypermetabolic adenopathy is identified within the right hilar region as well as the mediastinum. Index right paratracheal lymph node measures 2 cm and has an SUV max equal to 13.8, image 111. Smaller pulmonary nodules are scattered throughout the right lung and left upper lobe.  ABDOMEN/PELVIS  Multifocal hypermetabolic liver lesions are identified. Index lesion within the inferior right hepatic lobe measures 4.9 cm and has an SUV max equal to 21.1 within the  lateral segment of left hepatic lobe there is a hypermetabolic lesion measuring 3.1 cm. This has an SUV max equal to 14.4, image 160.  SKELETON  Multifocal hypermetabolic bone metastases identified. Index lesion within the proximal right humerus has an SUV max equal to 13.4, image 84. Hypermetabolic, lytic lesion involving the manubrium has an SUV max equal to 8.9, image 100. There is intense radiotracer uptake identified throughout the spine and bony pelvis. Abnormal increased uptake within the right femoral neck has an SUV max equal to 14.4, image 271.  IMPRESSION: 1. Examination is positive for diffuse metastatic disease throughout the chest, abdomen and pelvis. 2. Multi focal hypermetabolic pulmonary nodules and masses are identified compatible with metastatic disease. 3. Hypermetabolic mediastinal and right hilar lymph node metastasis. 4. Multi focal liver metastasis. 5. Extensive bone metastasis.   Electronically Signed   By: Signa Kell M.D.   On: 09/14/2013 14:12      IMPRESSION: 47 year old with metastatic melanoma and low back pain as well as right leg weakness  PLAN: I would like to get bilateral hip x-rays to evaluate for cortical strength given his lesions on PET scan. He may require orthopedic intervention.(These films were negative.) I have offered him palliative radiation to the lumbar spine. We discussed 10 treatments as an outpatient. We discussed nausea and diarrhea as possible side effects of treatment. We discussed the process of simulation the placement tattoos. We discussed the should not begin his oral chemotherapy agents now after radiation is complete. We discussed that the normal time to pain relief is about 2 weeks after treatment. We were able to set him up for simulation next week. I will also refer him to social work as he is the primary caregiver for his mother but yet need significant care himself.  I spent 40 minutes  face to face with the patient and more than 50% of that  time was spent in counseling and/or coordination of care.   ------------------------------------------------  Lurline Hare, MD

## 2013-09-22 NOTE — Progress Notes (Signed)
Please see the Nurse Progress Note in the MD Initial Consult Encounter for this patient. 

## 2013-09-22 NOTE — Telephone Encounter (Signed)
I refaxed rx for mekinist.

## 2013-09-22 NOTE — Telephone Encounter (Signed)
Called and left a message for Jeffrey Benitez to discuss xray results.  Trey Paula called back and let him know that his hip xray from today was normal with no evidence of fracture and that we will continue with the radiation to his lower spine per Dr. Michell Heinrich.

## 2013-09-26 ENCOUNTER — Other Ambulatory Visit: Payer: Medicaid Other

## 2013-09-26 ENCOUNTER — Encounter: Payer: Self-pay | Admitting: *Deleted

## 2013-09-26 ENCOUNTER — Encounter: Payer: Self-pay | Admitting: Oncology

## 2013-09-26 ENCOUNTER — Ambulatory Visit
Admission: RE | Admit: 2013-09-26 | Discharge: 2013-09-26 | Disposition: A | Discharge status: Home / Self Care | Payer: Medicaid Other | Source: Ambulatory Visit | Attending: Radiation Oncology | Admitting: Radiation Oncology

## 2013-09-26 DIAGNOSIS — C801 Malignant (primary) neoplasm, unspecified: Secondary | ICD-10-CM | POA: Insufficient documentation

## 2013-09-26 DIAGNOSIS — C7800 Secondary malignant neoplasm of unspecified lung: Secondary | ICD-10-CM

## 2013-09-26 DIAGNOSIS — C439 Malignant melanoma of skin, unspecified: Secondary | ICD-10-CM | POA: Insufficient documentation

## 2013-09-26 NOTE — Progress Notes (Signed)
Patient has been approved for free mekinist and tafinlar from [facility] to [facility] from 09/18/13-09/18/14.

## 2013-09-26 NOTE — Progress Notes (Signed)
Name: Jeffrey Benitez   MRN: [id number]  Date:  09/26/2013  DOB: [birth year]  Status:outpatient    DIAGNOSIS: Metastatic melanoma  CONSENT VERIFIED: yes   SET UP: Patient is setup supine   IMMOBILIZATION:  The following immobilization was used: Vacloc  NARRATIVE:  Pt [name] was brought to the CT Investment banker, corporate.  Identity was confirmed.  All relevant records and images related to the planned course of therapy were reviewed.  Then, the patient was positioned in a stable reproducible clinical set-up for radiation therapy.  CT images were obtained.  An isocenter was placed. Skin markings were placed.  The CT images were loaded into the planning software where the target and avoidance structures were contoured.  The radiation prescription was entered and confirmed. The patient was discharged in stable condition and tolerated simulation well.    TREATMENT PLANNING NOTE:  Treatment planning then occurred. I have requested : MLC's, isodose plan, basic dose calculation  I have requested 3 dimensional simulation with DVH of cord, kidneys, bowel and GTV  A total of 2 medically necessary complex treatment devices were created under my direct supervision and approval.

## 2013-09-29 ENCOUNTER — Emergency Department (HOSPITAL_COMMUNITY)
Admission: EM | Admit: 2013-09-29 | Discharge: 2013-10-02 | Disposition: E | Payer: Medicaid Other | Attending: Emergency Medicine | Admitting: Emergency Medicine

## 2013-09-29 ENCOUNTER — Encounter (HOSPITAL_COMMUNITY): Payer: Self-pay | Admitting: Emergency Medicine

## 2013-09-29 DIAGNOSIS — I509 Heart failure, unspecified: Secondary | ICD-10-CM | POA: Insufficient documentation

## 2013-09-29 DIAGNOSIS — I469 Cardiac arrest, cause unspecified: Secondary | ICD-10-CM | POA: Insufficient documentation

## 2013-09-29 DIAGNOSIS — Z87891 Personal history of nicotine dependence: Secondary | ICD-10-CM | POA: Insufficient documentation

## 2013-09-29 DIAGNOSIS — Z85820 Personal history of malignant melanoma of skin: Secondary | ICD-10-CM | POA: Insufficient documentation

## 2013-09-29 DIAGNOSIS — Z8719 Personal history of other diseases of the digestive system: Secondary | ICD-10-CM | POA: Insufficient documentation

## 2013-09-29 DIAGNOSIS — Z79899 Other long term (current) drug therapy: Secondary | ICD-10-CM | POA: Insufficient documentation

## 2013-09-29 DIAGNOSIS — Z87442 Personal history of urinary calculi: Secondary | ICD-10-CM | POA: Insufficient documentation

## 2013-09-29 DIAGNOSIS — I10 Essential (primary) hypertension: Secondary | ICD-10-CM | POA: Insufficient documentation

## 2013-09-29 DIAGNOSIS — E119 Type 2 diabetes mellitus without complications: Secondary | ICD-10-CM | POA: Insufficient documentation

## 2013-09-29 MED ORDER — ONDANSETRON HCL 4 MG/2ML IJ SOLN: 4.0000 mg | Freq: Once | INTRAMUSCULAR | Status: DC

## 2013-09-29 MED FILL — Medication: Qty: 1 | Status: AC

## 2013-10-02 DIAGNOSIS — 419620001 Death: Secondary | SNOMED CT

## 2013-10-02 NOTE — ED Provider Notes (Signed)
CSN: [id number]     Arrival date & time 2013-10-27  0411 History   First MD Initiated Contact with Patient 2013/10/27 0416     No chief complaint on file.  (Consider location/radiation/quality/duration/timing/severity/associated sxs/prior Treatment) HPI Comments: 47 year old male with a history of metastatic melanoma presenting in cardiac arrest with CPR in progress. According to EMS, he was found by his mother unresponsive at his home. Upon arrival, EMS found him apneic and pulseless. Asystole on the monitor. CPR was initiated. He was given 8 rounds of epinephrine as well as Narcan (which was given because multiple fentanyl patches were found on him).  CPR had been in progress for 45 minutes prior to arrival.  During that time, he had no changes in his status. Level V caveat.   Past Medical History  Diagnosis Date  . Kidney stones   . Hypertension   . Diabetes mellitus without complication   . CHF (congestive heart failure)   . Perforated sigmoid colon   . Diverticulitis   . Melanoma of back 08/28/13   Past Surgical History  Procedure Laterality Date  . Kidney stone surgery    . Colon surgery    . Colon ressection    . Video bronchoscopy Bilateral 08/23/2013    Procedure: VIDEO BRONCHOSCOPY WITH FLUORO;  Surgeon: Leslye Peer, MD;  Location: [facility] ENDOSCOPY;  Service: Cardiopulmonary;  Laterality: Bilateral;  . Video bronchoscopy with endobronchial ultrasound Right 08/25/2013    Procedure: VIDEO BRONCHOSCOPY WITH ENDOBRONCHIAL ULTRASOUND;  Surgeon: Leslye Peer, MD;  Location: [facility] OR;  Service: Thoracic;  Laterality: Right;  . Biospy of left lower back Left 08/28/13    Left Lower Back   Family History  Problem Relation Age of Onset  . Diabetes type II Mother   . Hypertension Father   . Diabetes type II Father   . Heart disease Father    History  Substance Use Topics  . Smoking status: Former Smoker    Types: Cigarettes  . Smokeless tobacco: Former Neurosurgeon    Quit date:  08/22/1999  . Alcohol Use: Yes     Comment: Socially on the weekends    Review of Systems  Unable to perform ROS   Allergies  Review of patient's allergies indicates no known allergies.  Home Medications   Current Outpatient Rx  Name  Route  Sig  Dispense  Refill  . dabrafenib mesylate (TAFLINAR) 50 MG capsule   Oral   Take 3 capsules (150 mg total) by mouth 2 (two) times daily. Take on an empty stomach 1 hour before or 2 hours after meals.   60 capsule   2     To start after radiation   . fentaNYL (DURAGESIC) 100 MCG/HR   Transdermal   Place 100 mcg onto the skin every 3 (three) days.         Marland Kitchen FLUoxetine (PROZAC) 20 MG capsule   Oral   Take 1 capsule (20 mg total) by mouth daily.      3   . furosemide (LASIX) 20 MG tablet   Oral   Take 20 mg by mouth.         . hydrALAZINE (APRESOLINE) 100 MG tablet   Oral   Take 100 mg by mouth 2 (two) times daily.         Marland Kitchen lisinopril (PRINIVIL,ZESTRIL) 40 MG tablet   Oral   Take 40 mg by mouth daily.         Marland Kitchen LORazepam (ATIVAN) 0.5 MG  tablet   Oral   Take 0.5 mg by mouth 2 (two) times daily as needed for anxiety.         . metFORMIN (GLUCOPHAGE) 500 MG tablet   Oral   Take 500 mg by mouth 2 (two) times daily with a meal.         . metoprolol (LOPRESSOR) 100 MG tablet   Oral   Take 100 mg by mouth 2 (two) times daily.         . ondansetron (ZOFRAN) 4 MG tablet   Oral   Take 2 tablets (8 mg total) by mouth every 8 (eight) hours as needed.   60 tablet   0   . oxyCODONE (OXY IR/ROXICODONE) 5 MG immediate release tablet   Oral   Take 1 tablet (5 mg total) by mouth every 4 (four) hours as needed.   60 tablet   0   . polyethylene glycol (MIRALAX / GLYCOLAX) packet   Oral   Take 17 g by mouth 2 (two) times daily.         Marland Kitchen senna (SENOKOT) 8.6 MG TABS tablet   Oral   Take 2 tablets (17.2 mg total) by mouth at bedtime.   120 each   0   . testosterone cypionate (DEPOTESTOTERONE CYPIONATE) 100  MG/ML injection   Intramuscular   Inject 100 mg into the muscle every 14 (fourteen) days. For IM use only         . trametinib dimethyl sulfoxide (MEKINIST) 2 MG tablet   Oral   Take 1 tablet (2 mg total) by mouth daily. Take 1 hour before or 2 hours after meals. Store refrigerated in original container.   30 tablet   2     Application  Committment to access sent.to start a ...    BP 0/0  Pulse 0  Resp 0  SpO2 0% Physical Exam  Constitutional:  Pale, cold extremities. Obese.  HENT:  King airway in place. Dark vomitus from airway.   Eyes:  Pupils fixed and dilated  Neck: Neck supple.  Cardiovascular:  No heart sounds. No pulses.  Pulmonary/Chest:  No respiratory effort.  Abdominal:  Soft, distended.  Musculoskeletal: Normal range of motion.  Neurological: He is unresponsive. GCS eye subscore is 1. GCS verbal subscore is 1. GCS motor subscore is 1.  Skin:  Cool, pale    ED Course  Procedures (including critical care time)  Cardiopulmonary Resuscitation (CPR) Procedure Note Directed/Performed by: Warnell Forester I personally directed ancillary staff and/or performed CPR in an effort to regain return of spontaneous circulation and to maintain cardiac, neuro and systemic perfusion.    Labs Review Labs Reviewed - No data to display Imaging Review No results found.    EKG Interpretation   None       MDM   1. Death   2. Cardiac arrest    47 year old male presenting in cardiac arrest.  CPR performed for 45 minutes prior to arrival. Initial assessment showed no pulses, patient cold, pupils fixed and dilated. CPR continued for several minutes, without ultimate change in status. Bedside ultrasound showed cardiac standstill. Further resuscitative efforts were felt to be futile. Time of death 4:13 AM.  Discussed with patient's mother.   Discussed with on call physician at [facility] office.    Candyce Churn, MD 2013-10-03 [phone]

## 2013-10-02 NOTE — Code Documentation (Signed)
[place] [facility] called.

## 2013-10-02 NOTE — Code Documentation (Signed)
Organ procurement team notified.

## 2013-10-02 NOTE — ED Notes (Signed)
PER EMS: pt from home, mother found pt unresponsive, blue, unwitnessed arrest, and called EMS.  PER EMS, EMS at scene, pt asystole on monitor, pupils fixed and dilated, unresponsive, no palpable pulses. CPR initiated by EMS, given 8 epi, D50, narcan en route to ER. Pt vomiting dark brown fluid. EMS discovered clear, fentanyl patches x 2 on pt body, unknown dosage. Upon arrival to ED CPR in progress, asystole and EMS reports CPR ongoing for 35 minutes. 2 bilateral  IOs in legs and 18g RAC.

## 2013-10-02 NOTE — Progress Notes (Signed)
Received page from ED, CPR patient expired, family on the way. Upon arrival, police officers attempting to locate patient's mother, who had not yet arrived at hospital. Chaplain asked to be paged when family arrived and was paged 45 minutes later. Chaplain met the deceased's mother while physician was notifying her of her son's death. Chaplain showed her to her son's bedside and remained with her, offering emotional, grief, and spiritual support, prayer, comforting presence, and reflective listening. When she was ready, chaplain gathered funeral home and contact information for RN and walked her to her car. She said she appreciatived the care and support.   Guy Sandifer [place], [place] [phone]

## 2013-10-02 NOTE — Code Documentation (Signed)
Patient time of death occurred at 0413. 

## 2013-10-02 NOTE — ED Notes (Signed)
Mom arrived. MD to talk with family.  Chaplain called. Mom in family consultation room.

## 2013-10-02 NOTE — Code Documentation (Signed)
ALL IN: Candyce Churn, MD Justice Deeds, RN EDWARD WHITE, RN JAMEL - [facility] 2 RESPIRATORY TECHS

## 2013-10-02 DEATH — deceased

## 2013-10-03 ENCOUNTER — Ambulatory Visit: Payer: Self-pay | Admitting: Internal Medicine

## 2013-10-03 ENCOUNTER — Other Ambulatory Visit: Payer: Self-pay | Admitting: Lab

## 2013-10-05 ENCOUNTER — Ambulatory Visit: Payer: Medicaid Other | Admitting: Radiation Oncology

## 2013-10-06 ENCOUNTER — Ambulatory Visit: Payer: Medicaid Other

## 2013-10-09 ENCOUNTER — Ambulatory Visit: Payer: Medicaid Other

## 2013-10-10 ENCOUNTER — Ambulatory Visit: Payer: Medicaid Other

## 2013-10-11 ENCOUNTER — Ambulatory Visit: Payer: Medicaid Other

## 2013-10-12 ENCOUNTER — Ambulatory Visit: Payer: Medicaid Other

## 2013-10-13 ENCOUNTER — Ambulatory Visit: Payer: Medicaid Other

## 2013-10-16 ENCOUNTER — Ambulatory Visit: Payer: Medicaid Other

## 2013-10-17 ENCOUNTER — Ambulatory Visit: Payer: Medicaid Other

## 2013-10-18 ENCOUNTER — Ambulatory Visit: Payer: Medicaid Other

## 2013-10-24 ENCOUNTER — Telehealth: Payer: Self-pay | Admitting: Medical Oncology

## 2013-10-24 NOTE — Telephone Encounter (Signed)
Taflinar returned to [employer] and company asking why. Information given .

## 2013-12-13 IMAGING — CR DG HIP W/ PELVIS BILAT
5 series · 5 of 5 positions shown · non-contrast
Comparison: None.

CLINICAL DATA: Bilateral hip pain, right greater than left.

EXAM:
BILATERAL HIP WITH PELVIS - 4+ VIEW

[t pelvis a.p.]
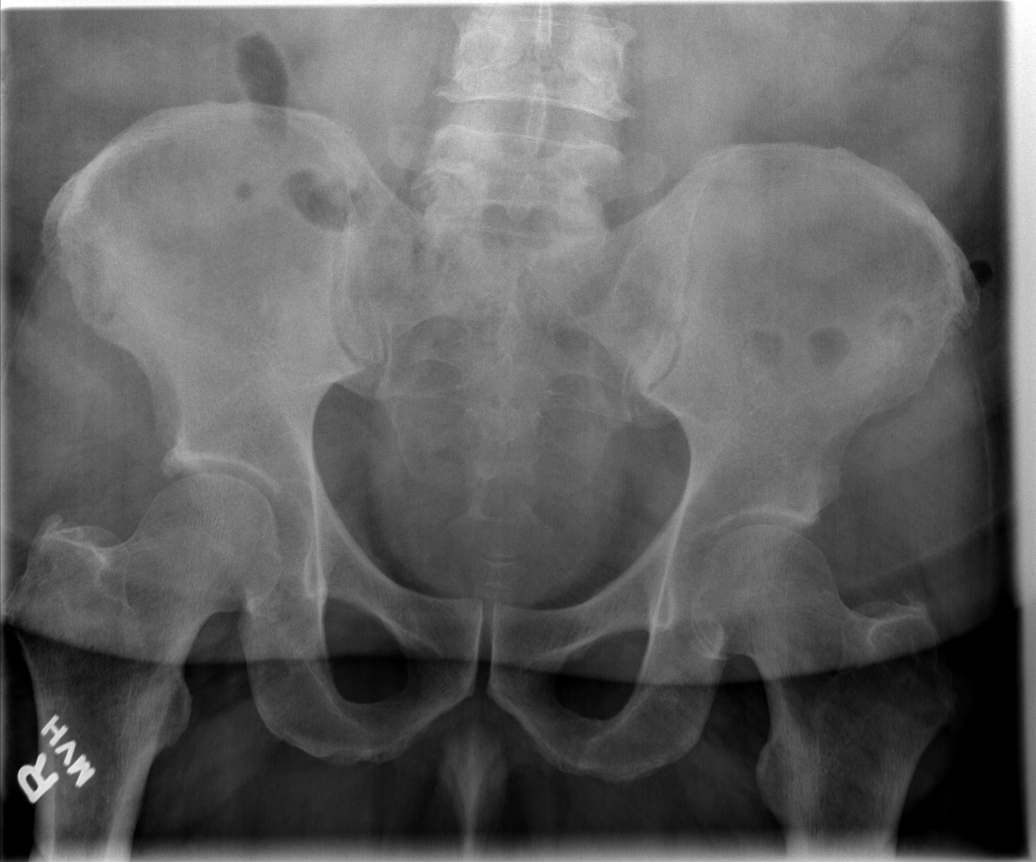

[t hip ap left]
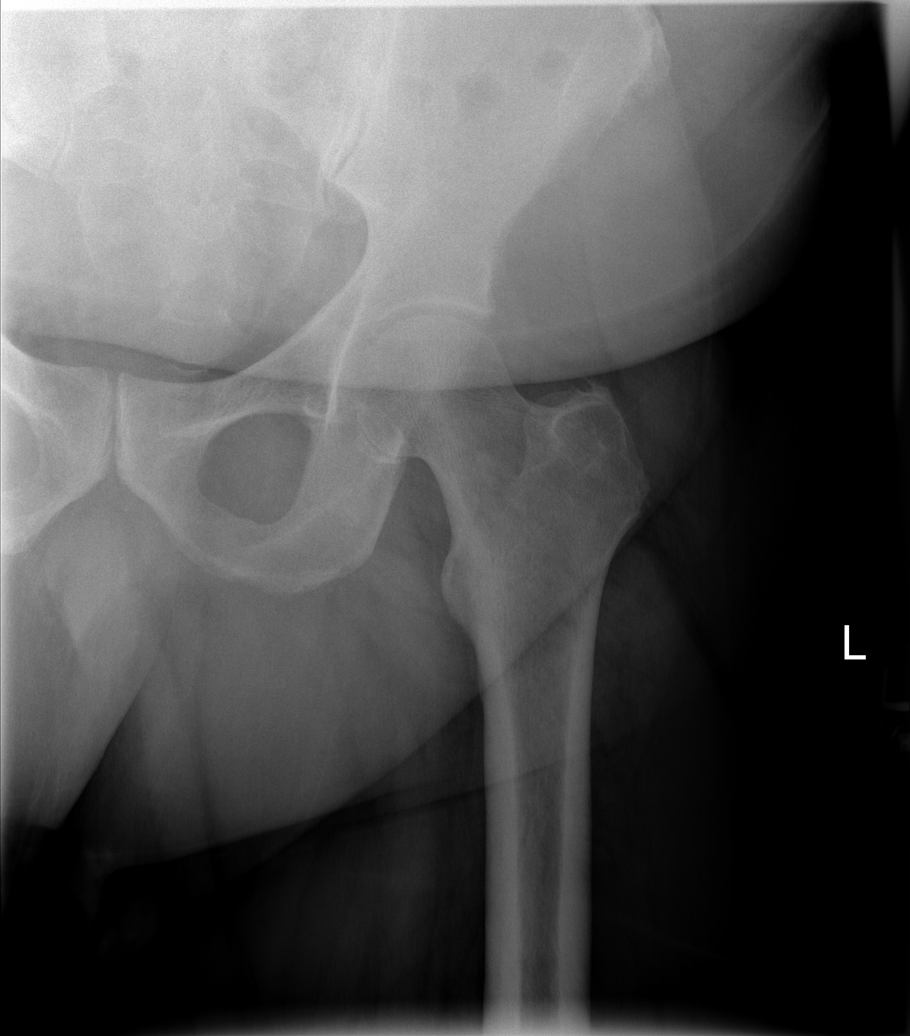

[t hip ap right]
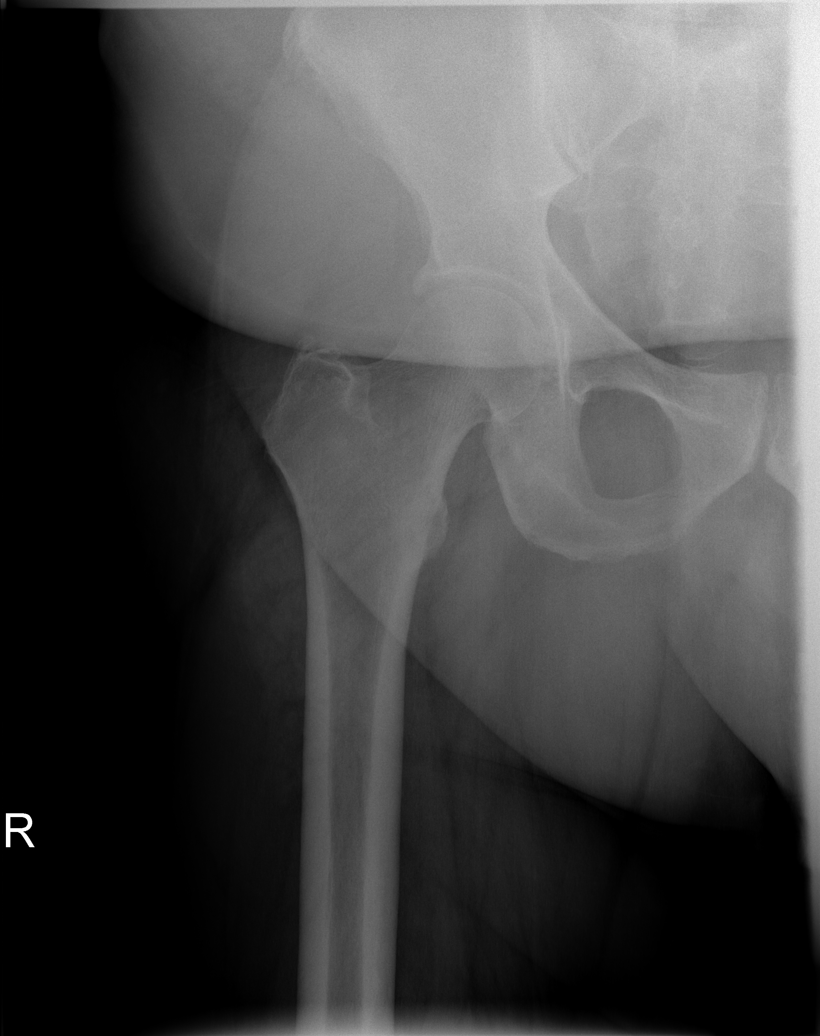

[t hip frog leg left]
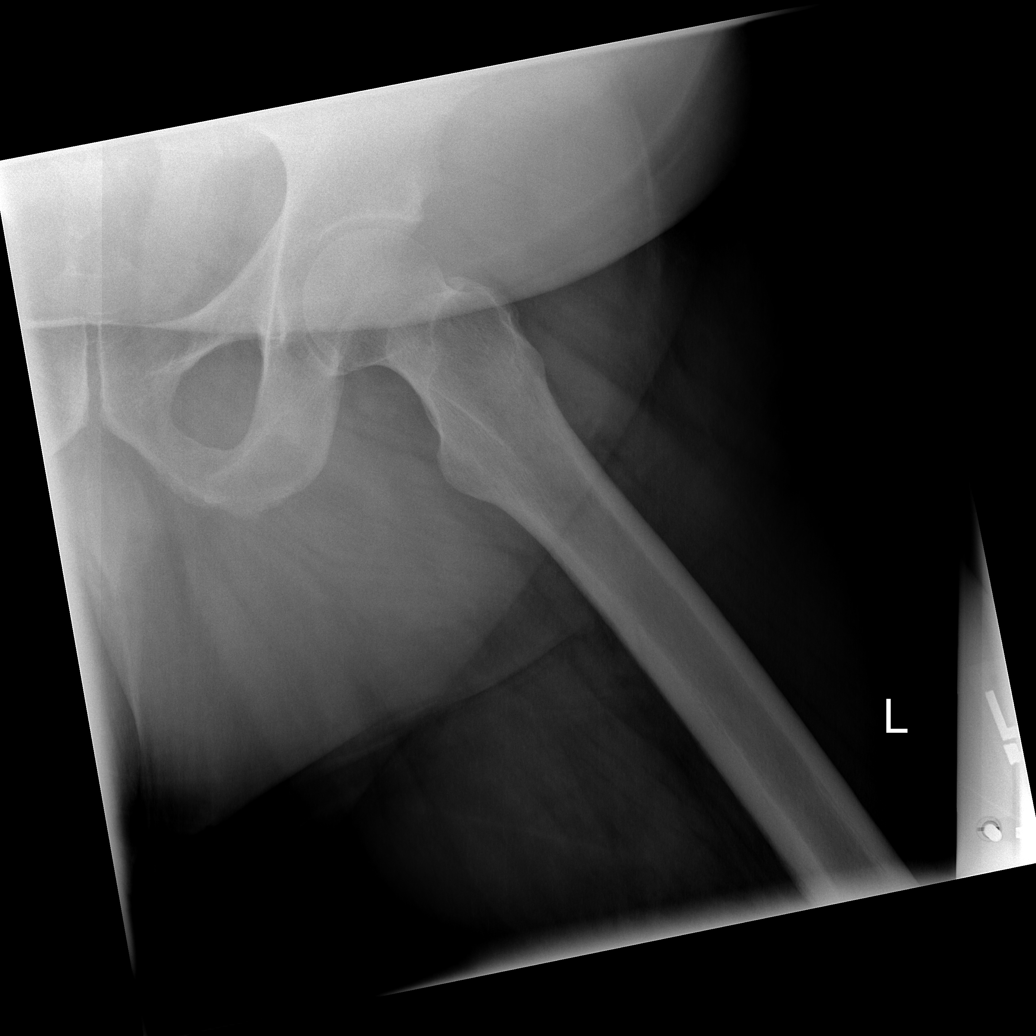

[t hip frog leg right]
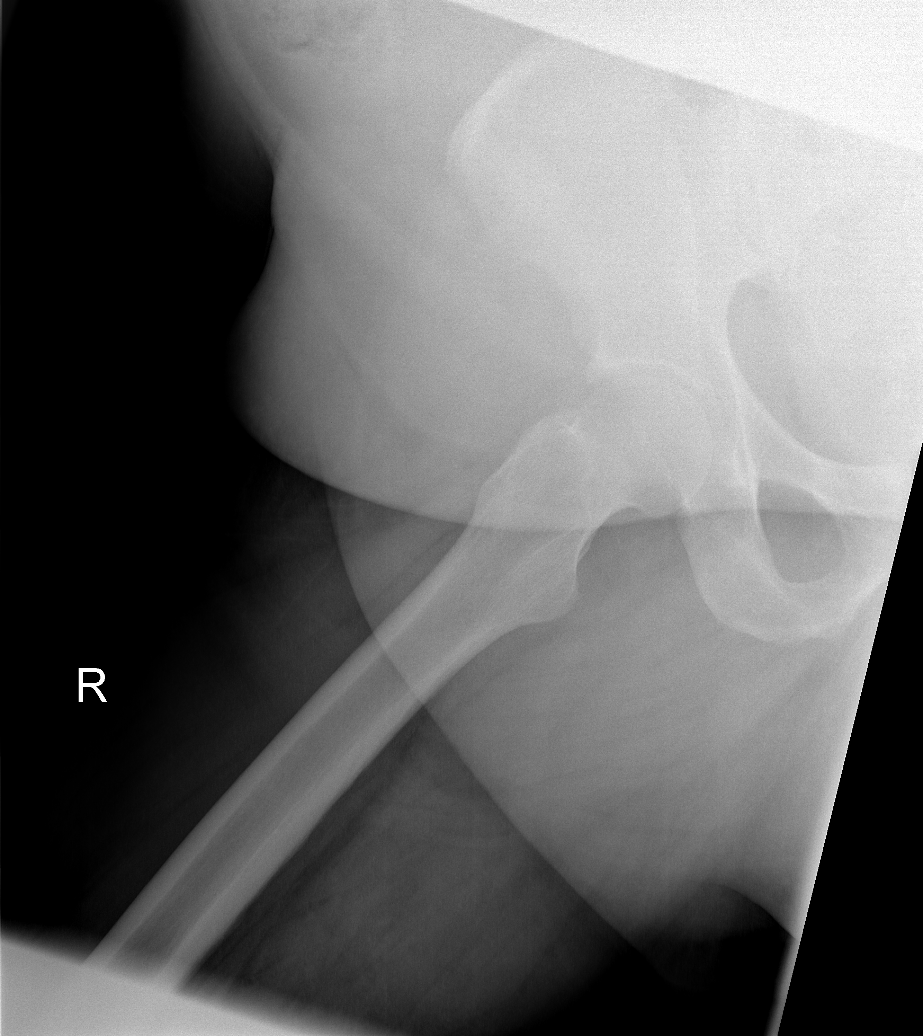

[5 of 5 positions shown; findings below may reference images not displayed]

FINDINGS: There is no fracture or dislocation. The joint spaces are
maintained. The SI joints are normal.
IMPRESSION: No acute osseous injury of bilateral hips. The osseous lesions seen
by PET-CT are not appreciated on the plain radiographs today.
